# Patient Record
Sex: Male | Born: 1955 | Race: White | Hispanic: No | Marital: Single | Smoking: Former smoker
Health system: Southern US, Community
[De-identification: ages and names within clinical notes are randomized; demographics above are authoritative.]

## PROBLEM LIST (undated history)

## (undated) DIAGNOSIS — I10 Essential (primary) hypertension: Secondary | ICD-10-CM

## (undated) DIAGNOSIS — E785 Hyperlipidemia, unspecified: Secondary | ICD-10-CM

## (undated) DIAGNOSIS — Z86711 Personal history of pulmonary embolism: Secondary | ICD-10-CM

## (undated) DIAGNOSIS — C20 Malignant neoplasm of rectum: Secondary | ICD-10-CM

## (undated) DIAGNOSIS — E119 Type 2 diabetes mellitus without complications: Secondary | ICD-10-CM

## (undated) DIAGNOSIS — I8393 Asymptomatic varicose veins of bilateral lower extremities: Secondary | ICD-10-CM

## (undated) HISTORY — DX: Personal history of pulmonary embolism: Z86.711

## (undated) HISTORY — DX: Type 2 diabetes mellitus without complications: E11.9

## (undated) HISTORY — DX: Hyperlipidemia, unspecified: E78.5

## (undated) HISTORY — PX: LEG SURGERY: SHX1003

## (undated) HISTORY — PX: TONSILLECTOMY: SUR1361

## (undated) HISTORY — DX: Asymptomatic varicose veins of bilateral lower extremities: I83.93

## (undated) HISTORY — DX: Malignant neoplasm of rectum: C20

## (undated) HISTORY — PX: OTHER SURGICAL HISTORY: SHX169

---

## 2004-08-02 ENCOUNTER — Observation Stay (HOSPITAL_COMMUNITY): Admission: EM | Admit: 2004-08-02 | Discharge: 2004-08-04 | Payer: Self-pay | Admitting: *Deleted

## 2004-08-03 ENCOUNTER — Encounter: Payer: Self-pay | Admitting: *Deleted

## 2004-08-27 ENCOUNTER — Ambulatory Visit (HOSPITAL_COMMUNITY): Admission: RE | Admit: 2004-08-27 | Discharge: 2004-08-27 | Payer: Self-pay | Admitting: Internal Medicine

## 2006-03-09 ENCOUNTER — Encounter
Admission: RE | Admit: 2006-03-09 | Discharge: 2006-06-07 | Payer: Self-pay | Admitting: Physical Medicine & Rehabilitation

## 2006-12-02 IMAGING — CR DG CHEST 2V
2 series · 2 of 2 positions shown · non-contrast
Comparison: none

CLINICAL DATA: Chest pain

Chest 2 view:
Comparison 08/02/2004. Ill-defined opacity at the left lung base  on previous
study corresponds to the anterior aspect of the 6 left rib. Lungs are clear.
Heart size and pulmonary vascularity normal. No effusion.

[view not recorded (1 of 2)]
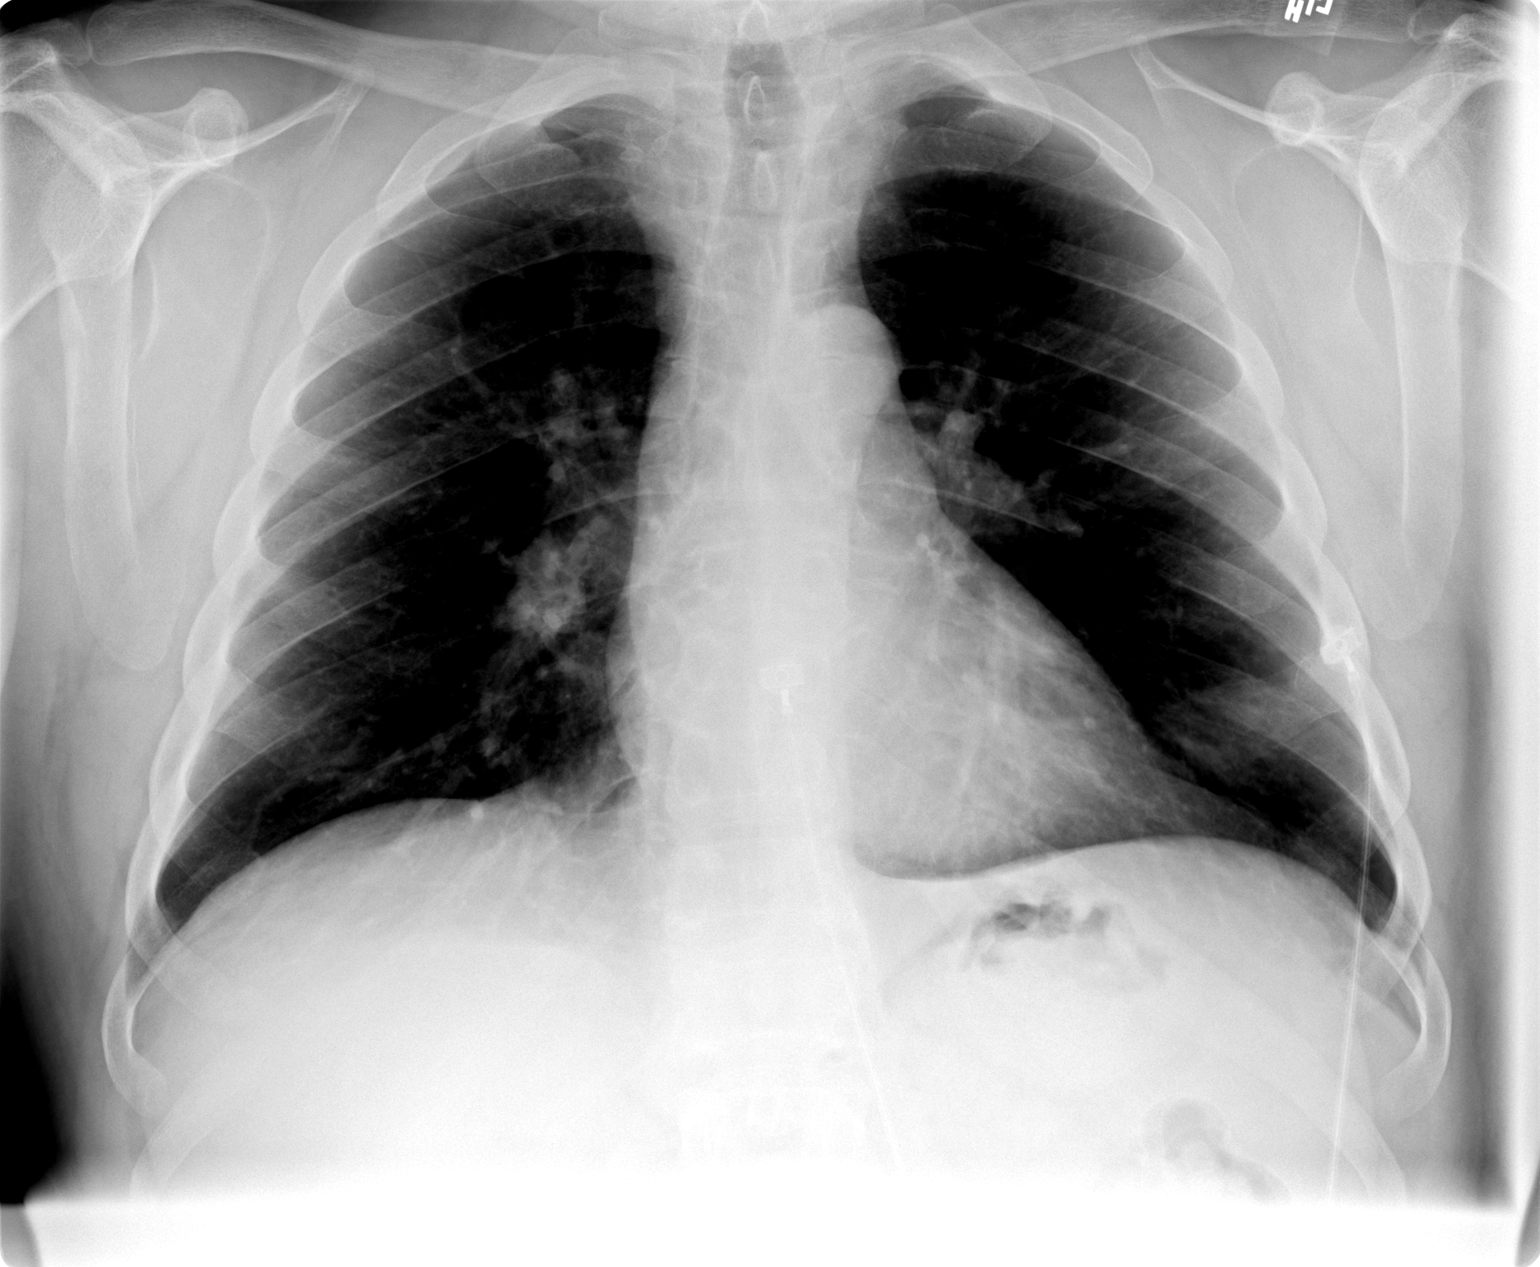

[view not recorded (2 of 2)]
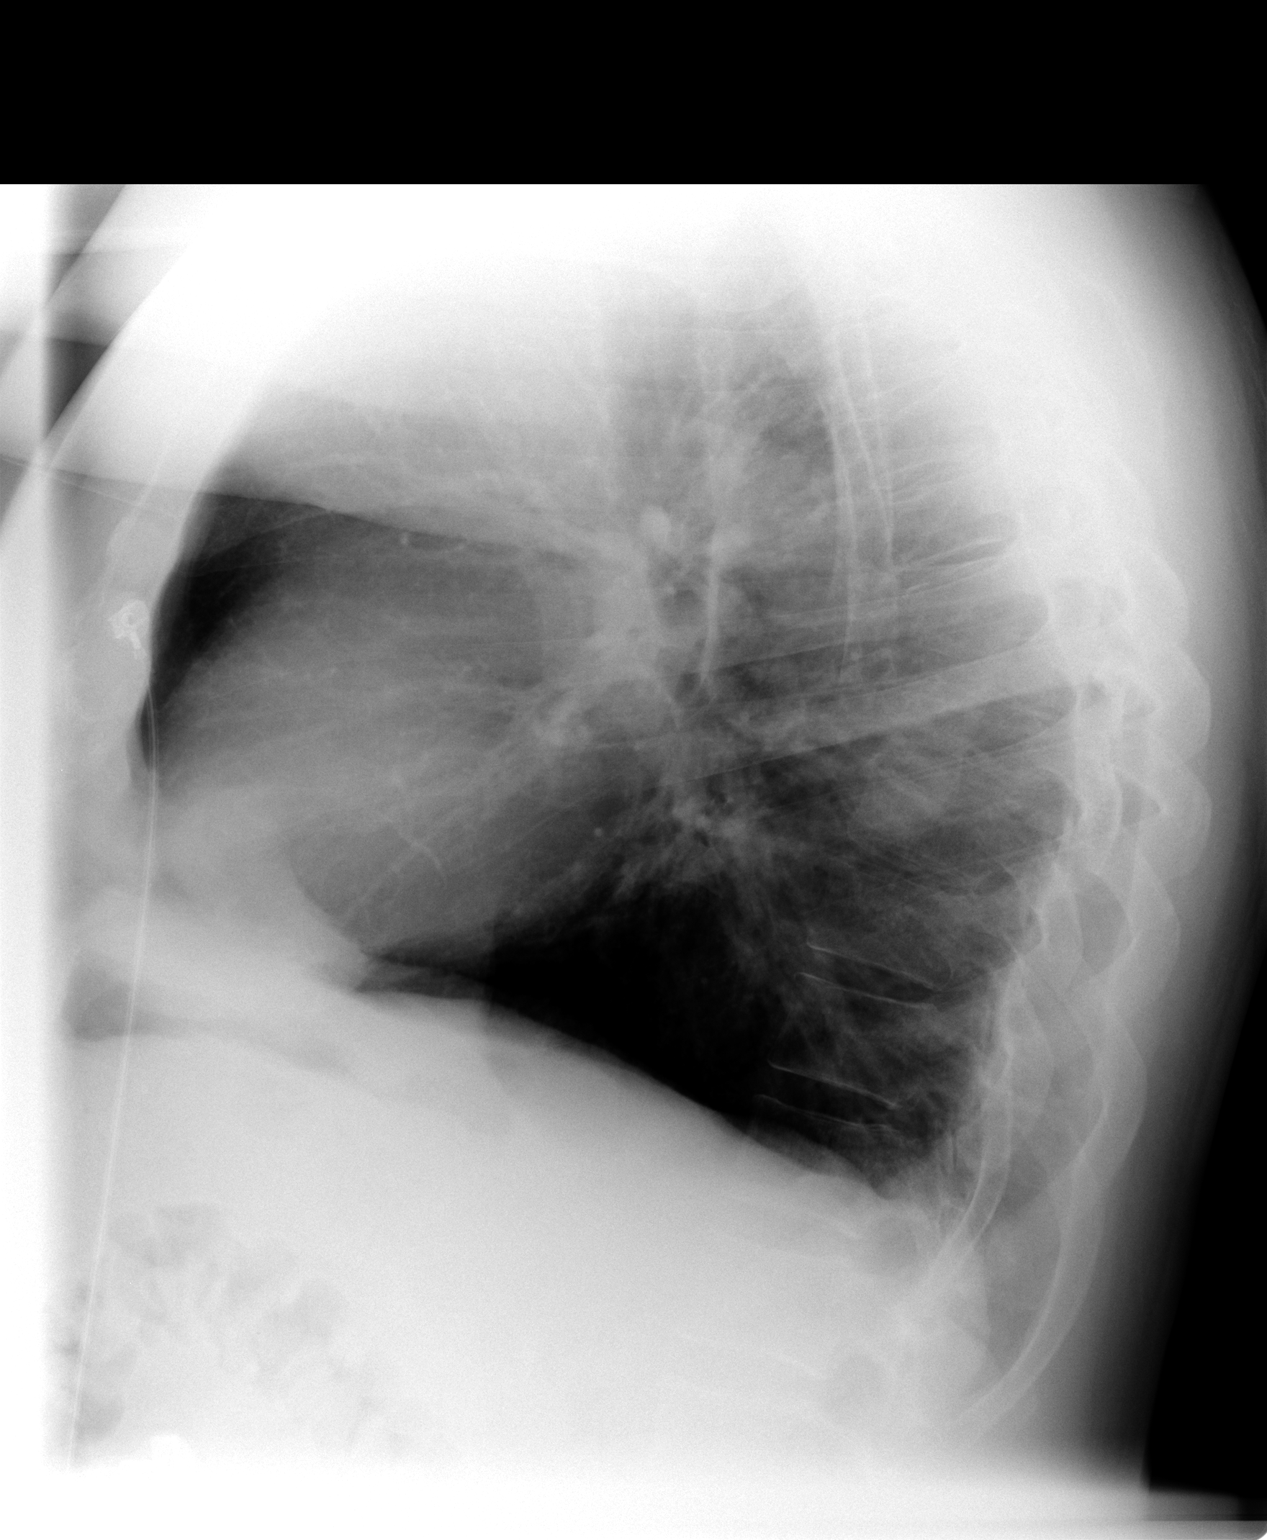

[2 of 2 positions shown; findings below may reference images not displayed]

IMPRESSION: 1. No acute disease

## 2006-12-03 IMAGING — CR DG CHEST 2V
2 series · 2 of 2 positions shown · non-contrast
Comparison: 08/02/2004.

CLINICAL DATA: Chest pain.  
CHEST - TWO VIEW:

[w chest pa]
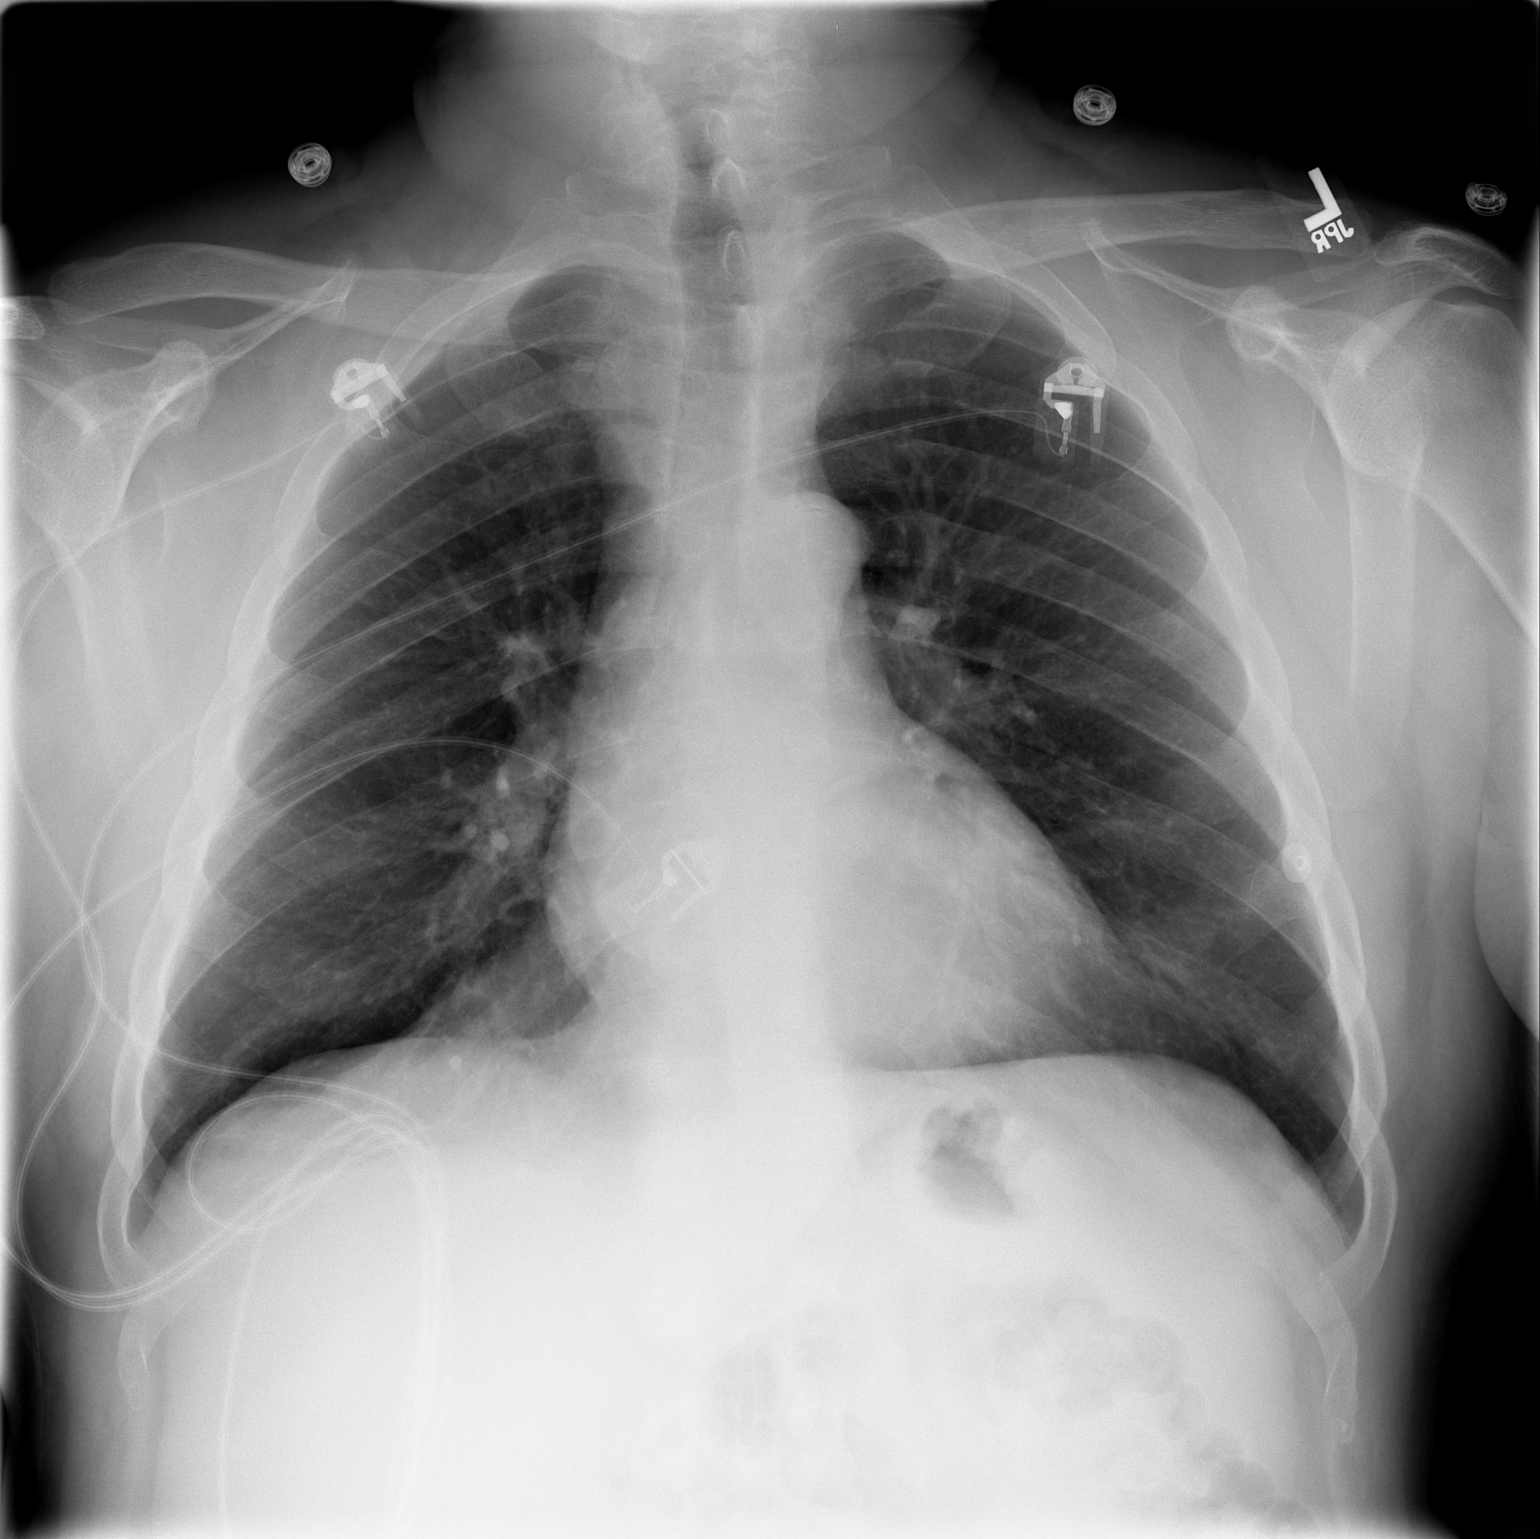

[w chest lat]
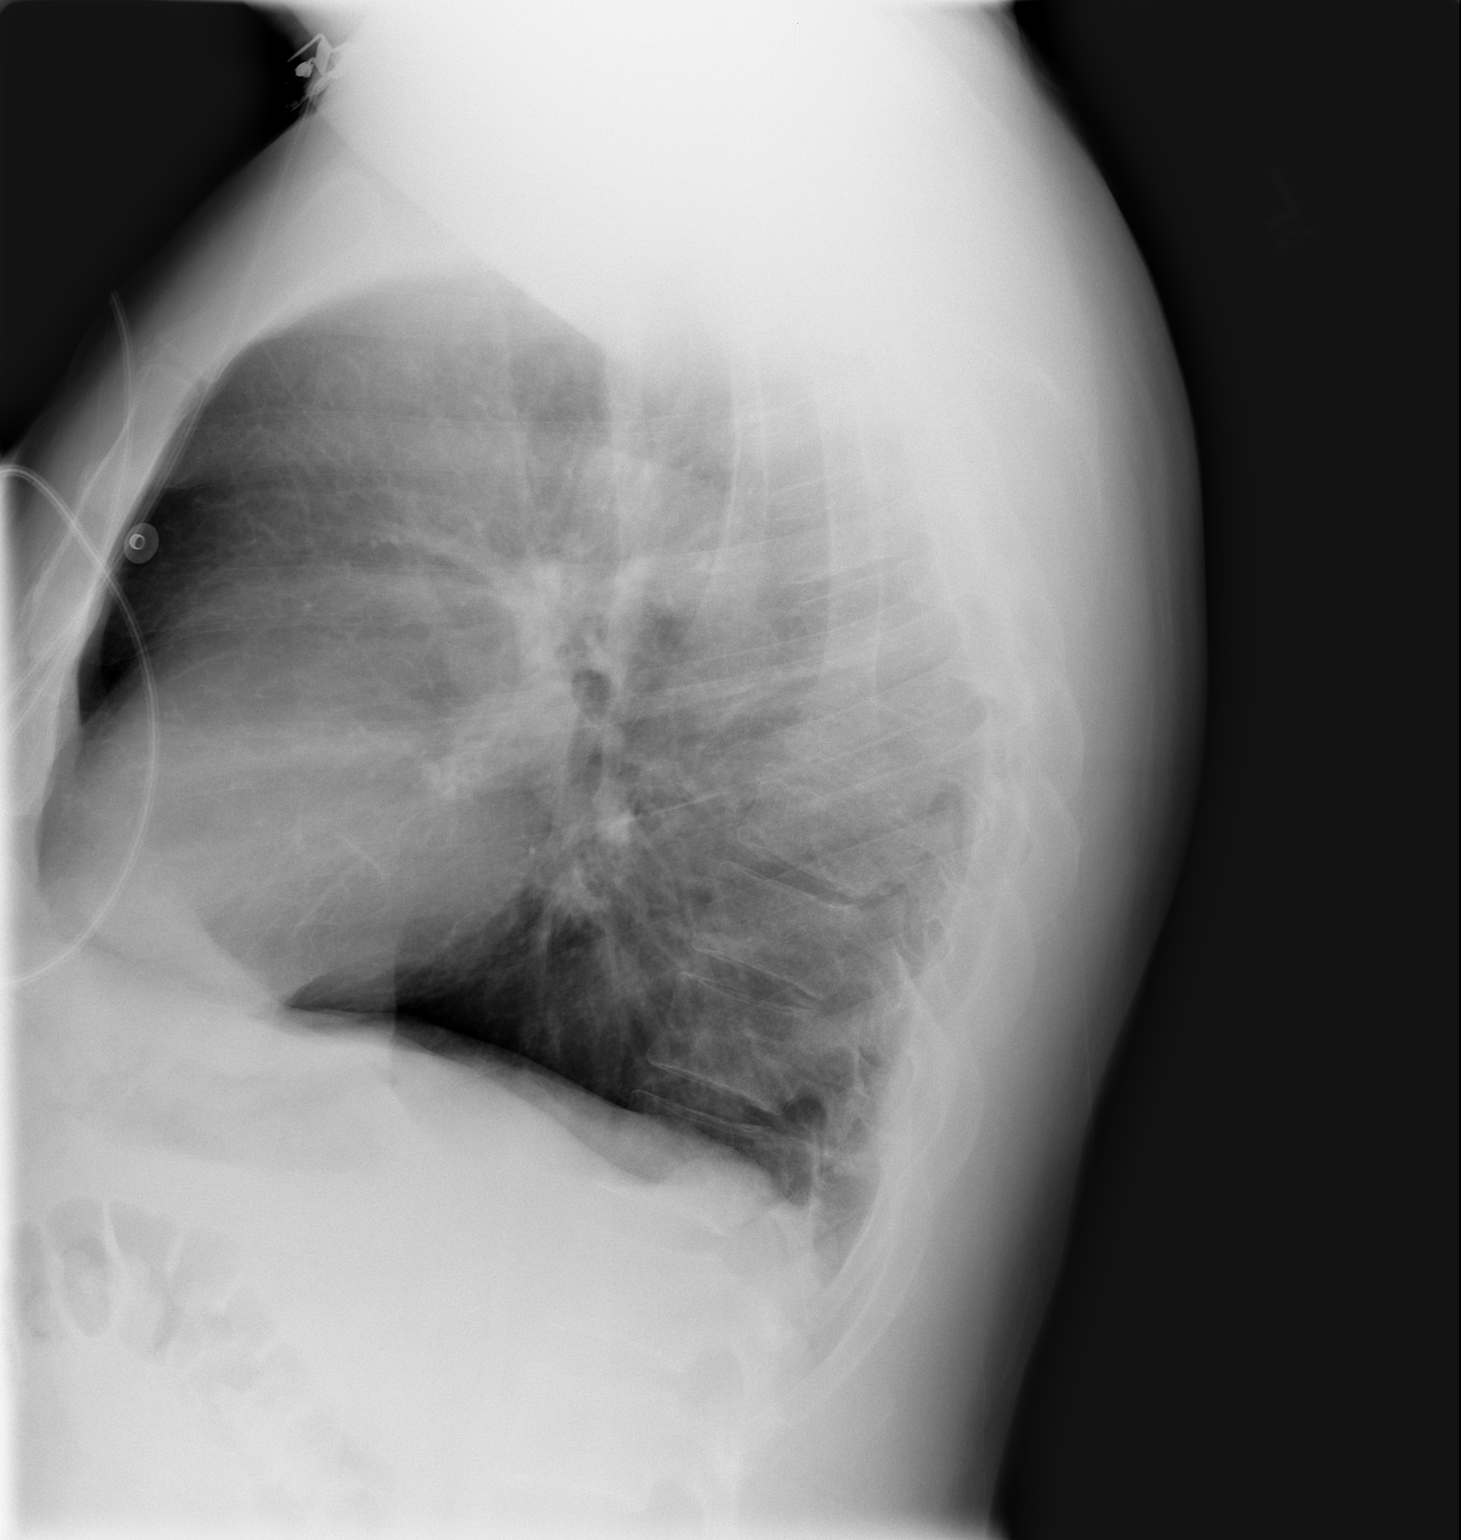

[2 of 2 positions shown; findings below may reference images not displayed]

FINDINGS: The cardiomediastinal silhouette is within normal limits.  The lungs are clear.  No pneumothoraces or effusions are seen.  A severe compression deformity of the mid-thoracic spine is noted.  On the lateral view, there is lobularity of the posterior hemidiaphragm.  This may reflect a diaphragmatic hernia or possibly a pleural abnormality.
IMPRESSION: 1.  Severe mid-thoracic compression fracture of indeterminate age. 
2.  A lobulated appearance of the posterior hemidiaphragm on the lateral view.  This may represent a diaphragmatic hernia or possibly a pleural abnormality such as loculated fluid or pleural soft tissue mass.  If no remote chest films are available for comparison, CT can be performed to further delineate pathology.

## 2010-05-14 ENCOUNTER — Emergency Department (HOSPITAL_COMMUNITY)
Admission: EM | Admit: 2010-05-14 | Discharge: 2010-05-14 | Disposition: A | Discharge status: Home / Self Care | Payer: Self-pay | Attending: Emergency Medicine | Admitting: Emergency Medicine

## 2010-05-14 DIAGNOSIS — E119 Type 2 diabetes mellitus without complications: Secondary | ICD-10-CM | POA: Insufficient documentation

## 2010-05-14 DIAGNOSIS — I10 Essential (primary) hypertension: Secondary | ICD-10-CM | POA: Insufficient documentation

## 2010-05-14 DIAGNOSIS — E7800 Pure hypercholesterolemia, unspecified: Secondary | ICD-10-CM | POA: Insufficient documentation

## 2010-05-14 DIAGNOSIS — R109 Unspecified abdominal pain: Secondary | ICD-10-CM | POA: Insufficient documentation

## 2010-05-14 DIAGNOSIS — Z79899 Other long term (current) drug therapy: Secondary | ICD-10-CM | POA: Insufficient documentation

## 2013-01-17 ENCOUNTER — Emergency Department (HOSPITAL_COMMUNITY): Payer: BC Managed Care – PPO

## 2013-01-17 ENCOUNTER — Encounter (HOSPITAL_COMMUNITY): Payer: Self-pay | Admitting: Emergency Medicine

## 2013-01-17 ENCOUNTER — Emergency Department (HOSPITAL_COMMUNITY)
Admission: EM | Admit: 2013-01-17 | Discharge: 2013-01-17 | Disposition: A | Discharge status: Home / Self Care | Payer: BC Managed Care – PPO | Attending: Emergency Medicine | Admitting: Emergency Medicine

## 2013-01-17 DIAGNOSIS — E119 Type 2 diabetes mellitus without complications: Secondary | ICD-10-CM | POA: Insufficient documentation

## 2013-01-17 DIAGNOSIS — G8929 Other chronic pain: Secondary | ICD-10-CM | POA: Insufficient documentation

## 2013-01-17 DIAGNOSIS — E7800 Pure hypercholesterolemia, unspecified: Secondary | ICD-10-CM | POA: Insufficient documentation

## 2013-01-17 DIAGNOSIS — H81399 Other peripheral vertigo, unspecified ear: Secondary | ICD-10-CM | POA: Insufficient documentation

## 2013-01-17 DIAGNOSIS — I10 Essential (primary) hypertension: Secondary | ICD-10-CM | POA: Insufficient documentation

## 2013-01-17 DIAGNOSIS — Z87891 Personal history of nicotine dependence: Secondary | ICD-10-CM | POA: Insufficient documentation

## 2013-01-17 DIAGNOSIS — I509 Heart failure, unspecified: Secondary | ICD-10-CM | POA: Insufficient documentation

## 2013-01-17 DIAGNOSIS — R109 Unspecified abdominal pain: Secondary | ICD-10-CM | POA: Insufficient documentation

## 2013-01-17 DIAGNOSIS — J3489 Other specified disorders of nose and nasal sinuses: Secondary | ICD-10-CM | POA: Insufficient documentation

## 2013-01-17 HISTORY — DX: Essential (primary) hypertension: I10

## 2013-01-17 LAB — CBC WITH DIFFERENTIAL/PLATELET
Basophils Absolute: 0.1 10*3/uL (ref 0.0–0.1)
Eosinophils Relative: 2 % (ref 0–5)
HCT: 45.5 % (ref 39.0–52.0)
Lymphs Abs: 3.9 10*3/uL (ref 0.7–4.0)
MCH: 32 pg (ref 26.0–34.0)
MCHC: 34.7 g/dL (ref 30.0–36.0)
Monocytes Absolute: 1.2 10*3/uL — ABNORMAL HIGH (ref 0.1–1.0)
Monocytes Relative: 8 % (ref 3–12)
RBC: 4.93 MIL/uL (ref 4.22–5.81)
RDW: 12.8 % (ref 11.5–15.5)

## 2013-01-17 LAB — URINALYSIS, ROUTINE W REFLEX MICROSCOPIC
Hgb urine dipstick: NEGATIVE
Leukocytes, UA: NEGATIVE
Nitrite: NEGATIVE
Protein, ur: NEGATIVE mg/dL
Specific Gravity, Urine: 1.025 (ref 1.005–1.030)

## 2013-01-17 LAB — COMPREHENSIVE METABOLIC PANEL
AST: 25 U/L (ref 0–37)
GFR calc Af Amer: >90 mL/min (ref >90)
Potassium: 4.3 mEq/L (ref 3.5–5.1)
Sodium: 135 mEq/L (ref 135–145)
Total Bilirubin: 0.3 mg/dL (ref 0.3–1.2)

## 2013-01-17 MED ORDER — ONDANSETRON HCL 4 MG PO TABS: 4.0000 mg | ORAL_TABLET | Freq: Three times a day (TID) | ORAL | Status: DC | PRN

## 2013-01-17 MED ORDER — MECLIZINE HCL 25 MG PO TABS: 25.0000 mg | ORAL_TABLET | Freq: Three times a day (TID) | ORAL | Status: DC | PRN

## 2013-01-17 MED ORDER — MECLIZINE HCL 12.5 MG PO TABS
25.0000 mg | ORAL_TABLET | Freq: Once | ORAL | Status: AC
  Administered 2013-01-17: 25 mg via ORAL
  Filled 2013-01-17 (×2): qty 2

## 2013-01-17 NOTE — ED Provider Notes (Signed)
CSN: [id number]     Arrival date & time 01/17/13  1335 History   First MD Initiated Contact with Patient 01/17/13 1340     Chief Complaint  Patient presents with  . Flank Pain  . Dizziness    HPI Pt was seen at 1340. Per pt and his spouse, c/o gradual onset and persistence of multiple intermittent episodes of "dizziness" for the past 2 days. Pt describes the dizziness as "everything is moving around." Pt states he has had runny/stuffy nose, sinus and ears congestion for the past 3 weeks. Pt also c/o gradual onset and persistence of constant left sided upper-lateral torso "pain" for the past several months to 1 year. Pt states he has an appointment with his PMD tomorrow for same. Denies any change in his symptoms since onset. Denies N/V/D, no dysuria/hematuria, no CP/palpitations, no SOB/cough, no injury, no rash, no visual changes, no focal motor weakness, no tingling/numbness in extremities, no ataxia, no slurred speech, no facial droop.    Past Medical History  Diagnosis Date  . Hypertension   . CHF (congestive heart failure)   . Chronic groin pain   . Diabetes mellitus without complication   . Hypercholesterolemia    Past Surgical History  Procedure Laterality Date  . Leg surgery    . Arm surgery      History  Substance Use Topics  . Smoking status: Former Games developer  . Smokeless tobacco: Current User  . Alcohol Use: Yes    Review of Systems ROS: Statement: All systems negative except as marked or noted in the HPI; Constitutional: Negative for fever and chills. ; ; Eyes: Negative for eye pain, redness and discharge. ; ; ENMT: Negative for ear pain, hoarseness, sore throat. +nasal congestion, rhinorrhea, sinus pressure. ; ; Cardiovascular: Negative for chest pain, palpitations, diaphoresis, dyspnea and peripheral edema. ; ; Respiratory: Negative for cough, wheezing and stridor. ; ; Gastrointestinal: Negative for nausea, vomiting, diarrhea, abdominal pain, blood in stool, hematemesis,  jaundice and rectal bleeding. . ; ; Genitourinary: +flank pain. Negative for dysuria, and hematuria. ; ; Musculoskeletal: Negative for back pain and neck pain. Negative for swelling and trauma.; ; Skin: Negative for pruritus, rash, abrasions, blisters, bruising and skin lesion.; ; Neuro: +"dizziness." Negative for headache, lightheadedness and neck stiffness. Negative for weakness, altered level of consciousness , altered mental status, extremity weakness, paresthesias, involuntary movement, seizure and syncope.      Allergies  Review of patient's allergies indicates no known allergies.  Home Medications  No current outpatient prescriptions on file. BP 157/91  Pulse 95  Temp(Src) 97.8 F (36.6 C) (Oral)  Resp 21  SpO2 95% Physical Exam 1345: Physical examination:  Nursing notes reviewed; Vital signs and O2 SAT reviewed;  Constitutional: Well developed, Well nourished, Well hydrated, In no acute distress; Head:  Normocephalic, atraumatic; Eyes: EOMI, PERRL, No scleral icterus; ENMT: TM's clear bilat. +edemetous nasal turbinates bilat with clear rhinorrhea. Mouth and pharynx normal, Mucous membranes moist; Neck: Supple, Full range of motion, No lymphadenopathy; Cardiovascular: Regular rate and rhythm, No gallop; Respiratory: Breath sounds clear & equal bilaterally, No wheezes.  Speaking full sentences with ease, Normal respiratory effort/excursion; Chest: Nontender, Movement normal; Abdomen: Protuberant. Soft, +mild left sided upper lateral torso tenderness to palp. No rash. Nondistended, Normal bowel sounds; Genitourinary: No CVA tenderness; Spine:  No midline CS, TS, LS tenderness.;; Extremities: Pulses normal, No tenderness, No edema, No calf edema or asymmetry.; Neuro: AA&Ox3, Major CN grossly intact. No facial droop. Speech clear. Climbs on  and off stretcher easily by himself. Gait steady. No gross focal motor or sensory deficits in extremities. +left horizontal end gaze fatigable nystagmus that  reproduces pt's "dizziness."; Skin: Color normal, Warm, Dry.   ED Course  Procedures  EKG Interpretation   None       MDM  MDM Reviewed: previous chart, nursing note and vitals Reviewed previous: labs Interpretation: labs and CT scan     Results for orders placed during the hospital encounter of 01/17/13  CBC WITH DIFFERENTIAL      Result Value Range   WBC 15.9 (*) 4.0 - 10.5 K/uL   RBC 4.93  4.22 - 5.81 MIL/uL   Hemoglobin 15.8  13.0 - 17.0 g/dL   HCT 19.1  47.8 - 29.5 %   MCV 92.3  78.0 - 100.0 fL   MCH 32.0  26.0 - 34.0 pg   MCHC 34.7  30.0 - 36.0 g/dL   RDW 62.1  30.8 - 65.7 %   Platelets 249  150 - 400 K/uL   Neutrophils Relative % 66  43 - 77 %   Neutro Abs 10.5 (*) 1.7 - 7.7 K/uL   Lymphocytes Relative 24  12 - 46 %   Lymphs Abs 3.9  0.7 - 4.0 K/uL   Monocytes Relative 8  3 - 12 %   Monocytes Absolute 1.2 (*) 0.1 - 1.0 K/uL   Eosinophils Relative 2  0 - 5 %   Eosinophils Absolute 0.3  0.0 - 0.7 K/uL   Basophils Relative 1  0 - 1 %   Basophils Absolute 0.1  0.0 - 0.1 K/uL  COMPREHENSIVE METABOLIC PANEL      Result Value Range   Sodium 135  135 - 145 mEq/L   Potassium 4.3  3.5 - 5.1 mEq/L   Chloride 97  96 - 112 mEq/L   CO2 28  19 - 32 mEq/L   Glucose, Bld 118 (*) 70 - 99 mg/dL   BUN 17  6 - 23 mg/dL   Creatinine, Ser 8.46  0.50 - 1.35 mg/dL   Calcium 96.2  8.4 - 95.2 mg/dL   Total Protein 8.0  6.0 - 8.3 g/dL   Albumin 4.1  3.5 - 5.2 g/dL   AST 25  0 - 37 U/L   ALT 33  0 - 53 U/L   Alkaline Phosphatase 82  39 - 117 U/L   Total Bilirubin 0.3  0.3 - 1.2 mg/dL   GFR calc non Af Amer >90  >90 mL/min   GFR calc Af Amer >90  >90 mL/min  LIPASE, BLOOD      Result Value Range   Lipase 40  11 - 59 U/L  URINALYSIS, ROUTINE W REFLEX MICROSCOPIC      Result Value Range   Color, Urine YELLOW  YELLOW   APPearance CLEAR  CLEAR   Specific Gravity, Urine 1.025  1.005 - 1.030   pH 6.0  5.0 - 8.0   Glucose, UA NEGATIVE  NEGATIVE mg/dL   Hgb urine dipstick  NEGATIVE  NEGATIVE   Bilirubin Urine NEGATIVE  NEGATIVE   Ketones, ur NEGATIVE  NEGATIVE mg/dL   Protein, ur NEGATIVE  NEGATIVE mg/dL   Urobilinogen, UA 0.2  0.0 - 1.0 mg/dL   Nitrite NEGATIVE  NEGATIVE   Leukocytes, UA NEGATIVE  NEGATIVE   Ct Abdomen Pelvis Wo Contrast 01/17/2013   CLINICAL DATA:  Left flank pain, dizziness  EXAM: CT ABDOMEN AND PELVIS WITHOUT CONTRAST  TECHNIQUE: Multidetector CT imaging of  the abdomen and pelvis was performed following the standard protocol without intravenous contrast.  COMPARISON:  None.  FINDINGS: Sagittal images of the spine are unremarkable. Lung bases are unremarkable. Unenhanced liver shows no biliary ductal dilatation. Noncalcified gallstone within gallbladder measures arteries 2.5 cm. No pericholecystic fluid. The pancreas, spleen and adrenals are unremarkable. Unenhanced kidneys are symmetrical in size. No nephrolithiasis. No hydronephrosis or hydroureter. Small accessory splenule. No calcified ureteral calculi. Normal appendix. No pericecal inflammation. No aortic aneurysm.  Moderate stool noted in left colon and transverse colon. Some stool noted in sigmoid colon. No small bowel or colonic obstruction. No ascites or free air. No adenopathy.  Prostate gland and seminal vesicles are unremarkable. Small left inguinal carinal hernia containing fat without evidence of acute complication. No destructive bony lesions are noted within pelvis.  IMPRESSION: 1. There is non calcified gallstone within gallbladder measures 2.5 cm. 2. No nephrolithiasis.  No hydronephrosis or hydroureter. 3. No calcified ureteral calculi. 4. Normal appendix.  No pericecal inflammation. 5. Moderate colonic stool.   Electronically Signed   By: Natasha Mead M.D.   On: 01/17/2013 14:29   Ct Head Wo Contrast 01/17/2013   CLINICAL DATA:  Dizziness.  Abdominal pain  EXAM: CT HEAD WITHOUT CONTRAST  TECHNIQUE: Contiguous axial images were obtained from the base of the skull through the vertex  without intravenous contrast.  COMPARISON:  None.  FINDINGS: Ventricle size is normal. Negative for acute or chronic infarct. Negative for hemorrhage or mass. No skull lesion identified.  IMPRESSION: Normal   Electronically Signed   By: Marlan Palau M.D.   On: 01/17/2013 14:24    1500:  Pt states he feels better now after antivert and wants to go home. Not orthostatic. Has tol PO well without N/V. Neuro exam remains intact and unchanged. Appears to be vertigo at this time; will tx symptomatically. No acute findings on CT scan to account for left sided torso "pain;" pt already has PMD f/u tomorrow. Dx and testing d/w pt and family.  Questions answered.  Verb understanding, agreeable to d/c home with outpt f/u.     Laray Anger, DO 01/20/13 1422

## 2013-01-17 NOTE — ED Notes (Signed)
Pt c/o left flank pain as well as intermittent dizziness. Pt states symptoms have been present "for awhile". Pt denies NVD.

## 2013-01-17 NOTE — ED Notes (Signed)
C/o llq pain intermittent x 2 days along with dizziness.

## 2014-05-26 ENCOUNTER — Encounter (HOSPITAL_COMMUNITY): Payer: Self-pay | Admitting: *Deleted

## 2014-05-26 ENCOUNTER — Emergency Department (HOSPITAL_COMMUNITY)
Admission: EM | Admit: 2014-05-26 | Discharge: 2014-05-26 | Disposition: A | Discharge status: Home / Self Care | Payer: 59 | Attending: Emergency Medicine | Admitting: Emergency Medicine

## 2014-05-26 DIAGNOSIS — I509 Heart failure, unspecified: Secondary | ICD-10-CM | POA: Diagnosis not present

## 2014-05-26 DIAGNOSIS — Z79899 Other long term (current) drug therapy: Secondary | ICD-10-CM | POA: Insufficient documentation

## 2014-05-26 DIAGNOSIS — Z7982 Long term (current) use of aspirin: Secondary | ICD-10-CM | POA: Insufficient documentation

## 2014-05-26 DIAGNOSIS — E119 Type 2 diabetes mellitus without complications: Secondary | ICD-10-CM | POA: Diagnosis not present

## 2014-05-26 DIAGNOSIS — L600 Ingrowing nail: Secondary | ICD-10-CM | POA: Diagnosis present

## 2014-05-26 DIAGNOSIS — I10 Essential (primary) hypertension: Secondary | ICD-10-CM | POA: Diagnosis not present

## 2014-05-26 DIAGNOSIS — G8929 Other chronic pain: Secondary | ICD-10-CM | POA: Insufficient documentation

## 2014-05-26 MED ORDER — SULFAMETHOXAZOLE-TRIMETHOPRIM 800-160 MG PO TABS
1.0000 | ORAL_TABLET | Freq: Once | ORAL | Status: AC
  Administered 2014-05-26: 1 via ORAL
  Filled 2014-05-26: qty 1

## 2014-05-26 MED ORDER — BUPIVACAINE HCL (PF) 0.25 % IJ SOLN
10.0000 mL | Freq: Once | INTRAMUSCULAR | Status: AC
  Administered 2014-05-26: 10 mL
  Filled 2014-05-26: qty 30

## 2014-05-26 MED ORDER — OXYCODONE-ACETAMINOPHEN 5-325 MG PO TABS: 1.0000 | ORAL_TABLET | Freq: Once | ORAL | Status: DC

## 2014-05-26 MED ORDER — SULFAMETHOXAZOLE-TRIMETHOPRIM 800-160 MG PO TABS: 1.0000 | ORAL_TABLET | Freq: Two times a day (BID) | ORAL | Status: AC

## 2014-05-26 MED ORDER — LIDOCAINE HCL (PF) 1 % IJ SOLN
5.0000 mL | Freq: Once | INTRAMUSCULAR | Status: AC
  Administered 2014-05-26: 5 mL
  Filled 2014-05-26: qty 5

## 2014-05-26 MED ORDER — IBUPROFEN 600 MG PO TABS: 600.0000 mg | ORAL_TABLET | Freq: Four times a day (QID) | ORAL | Status: DC | PRN

## 2014-05-26 MED ORDER — OXYCODONE-ACETAMINOPHEN 5-325 MG PO TABS: 2.0000 | ORAL_TABLET | ORAL | Status: DC | PRN

## 2014-05-26 NOTE — ED Provider Notes (Signed)
CSN: [id number]     Arrival date & time 05/26/14  1853 History  This chart was scribed for non-physician practitioner Debroah Baller, NP, working with Orpah Greek, MD by Zola Button, ED Scribe. This patient was seen in room APFT24/APFT24 and the patient's care was started at 7:48 PM.      Chief Complaint  Patient presents with  . Ingrown Toenail   The history is provided by the patient. No language interpreter was used.   HPI Comments: Donald Mason is a 59 y.o. male who presents to the Emergency Department complaining of gradual onset, worsening ingrown toenail to left great toe that started 2 weeks ago but worsened this past week. Patient reports pain, redness and swelling to the area. He has tried peroxide. He has called his PCP, but is unable to see him for 10 weeks.   Past Medical History  Diagnosis Date  . Hypertension   . CHF (congestive heart failure)   . Chronic groin pain   . Diabetes mellitus without complication   . Hypercholesterolemia    Past Surgical History  Procedure Laterality Date  . Leg surgery    . Arm surgery     History reviewed. No pertinent family history. History  Substance Use Topics  . Smoking status: Former Research scientist (life sciences)  . Smokeless tobacco: Current User  . Alcohol Use: Yes    Review of Systems  Musculoskeletal:       Left great toe pain and redness  Skin: Positive for wound.      Allergies  Review of patient's allergies indicates no known allergies.  Home Medications   Prior to Admission medications   Medication Sig Start Date End Date Taking? Authorizing Provider  acetaminophen (TYLENOL) 500 MG tablet Take 1,000 mg by mouth every 6 (six) hours as needed for mild pain.   Yes Historical Provider, MD  Aspirin-Salicylamide-Caffeine (BC HEADACHE POWDER PO) Take 1 packet by mouth every 4 (four) hours as needed (Pain).   Yes Historical Provider, MD  Cyanocobalamin (VITAMIN B-12 PO) Take 2 tablets by mouth daily.   Yes Historical  Provider, MD  furosemide (LASIX) 40 MG tablet Take 40 mg by mouth daily.    Yes Historical Provider, MD  hydroxypropyl methylcellulose / hypromellose (ISOPTO TEARS / GONIOVISC) 2.5 % ophthalmic solution Place 1 drop into both eyes as needed for dry eyes.   Yes Historical Provider, MD  lisinopril (PRINIVIL,ZESTRIL) 20 MG tablet Take 20 mg by mouth daily.   Yes Historical Provider, MD  metFORMIN (GLUCOPHAGE) 500 MG tablet Take 500 mg by mouth daily.    Yes Historical Provider, MD  Omega-3 Fatty Acids (FISH OIL PO) Take 2 capsules by mouth daily.   Yes Historical Provider, MD  potassium chloride (K-DUR) 10 MEQ tablet Take 10 mEq by mouth daily.   Yes Historical Provider, MD  ibuprofen (ADVIL,MOTRIN) 600 MG tablet Take 1 tablet (600 mg total) by mouth every 6 (six) hours as needed. 05/26/14   Novice Vrba Bunnie Pion, NP  meclizine (ANTIVERT) 25 MG tablet Take 1 tablet (25 mg total) by mouth 3 (three) times daily as needed for dizziness. Patient not taking: Reported on 05/26/2014 01/17/13   Francine Graven, DO  ondansetron (ZOFRAN) 4 MG tablet Take 1 tablet (4 mg total) by mouth every 8 (eight) hours as needed for nausea or vomiting. Patient not taking: Reported on 05/26/2014 01/17/13   Francine Graven, DO  oxyCODONE-acetaminophen (PERCOCET/ROXICET) 5-325 MG per tablet Take 2 tablets by mouth every 4 (four) hours as  needed for severe pain. 05/26/14   Jakeia Carreras Bunnie Pion, NP  pregabalin (LYRICA) 100 MG capsule Take 100 mg by mouth 2 (two) times daily.    Historical Provider, MD  sulfamethoxazole-trimethoprim (BACTRIM DS,SEPTRA DS) 800-160 MG per tablet Take 1 tablet by mouth 2 (two) times daily. 05/26/14 06/02/14  [place], NP   BP 115/68 mmHg  Pulse 74  Temp(Src) 98.7 F (37.1 C) (Oral)  Resp 18  Ht 5\' 9"  (1.753 m)  Wt 229 lb (103.874 kg)  BMI 33.80 kg/m2  SpO2 100% Physical Exam  Constitutional: He is oriented to person, place, and time. He appears well-developed and well-nourished.  Eyes: EOM are normal.  Neck:  Neck supple.  Pulmonary/Chest: Effort normal.  Abdominal: Soft. There is no tenderness.  Musculoskeletal: Normal range of motion.  Neurological: He is alert and oriented to person, place, and time. No cranial nerve deficit.  Skin: Skin is warm and dry.  Left great toe: Swollen and tender at the edge of the nail. Infected ingrown toenail.  Nursing note and vitals reviewed.   ED Course  NAIL REMOVAL Date/Time: 05/26/2014 9:10 PM Performed by: Ashley Murrain Authorized by: Ashley Murrain Consent: Verbal consent obtained. Risks and benefits: risks, benefits and alternatives were discussed Consent given by: patient Patient understanding: patient states understanding of the procedure being performed Required items: required blood products, implants, devices, and special equipment available Patient identity confirmed: verbally with patient Location: left foot Anesthesia: digital block Local anesthetic: bupivacaine 0.25% without epinephrine and lidocaine 1% without epinephrine Anesthetic total: 5 ml Patient sedated: no Preparation: skin prepped with Betadine and sterile field established Amount removed: 1/5 Wedge excision of skin of nail fold: yes Nail bed sutured: no Nail matrix removed: partial Removed nail replaced and anchored: no Dressing: dressing applied Patient tolerance: Patient tolerated the procedure well with no immediate complications     DIAGNOSTIC STUDIES: Oxygen Saturation is 100% on room air, normal by my interpretation.    COORDINATION OF CARE: 7:50 PM-Discussed treatment plan which includes digital block and partial nail removal with pt at bedside and pt agreed to plan.     MDM  59 y.o. male with pain, redness and swelling to the left great toe due to infected ingrown toenail. Will treat with antibiotics and pain medication. He will continue to soak the toe in warm water and return for worsening symptoms. Stable for discharge without red streaking.  Final  diagnoses:  Ingrown left big toenail   I personally performed the services described in this documentation, which was scribed in my presence. The recorded information has been reviewed and is accurate.    [address] [place], NP 05/27/14 [place], MD 05/28/14 1515

## 2014-05-26 NOTE — ED Notes (Signed)
Pt c/o ingrown toenail to left toe x 1 week; toe is red and swollen with some brownish-green drainage; pt states it is painful when it rubs against an object

## 2014-05-26 NOTE — ED Notes (Signed)
Pt was given a pre-package of Oxycodone-Acetaminophen quantity 6, given instructions on use, pt verbally understands.

## 2014-05-26 NOTE — Discharge Instructions (Signed)
Ingrown Toenail An ingrown toenail occurs when the sharp edge of your toenail grows into the skin. Causes of ingrown toenails include toenails clipped too far back or poorly fitting shoes. Activities involving sudden stops (basketball, tennis) causing "toe jamming" may lead to an ingrown nail. HOME CARE INSTRUCTIONS   Soak the whole foot in warm soapy water for 20 minutes, 3 times per day.  You may lift the edge of the nail away from the sore skin by wedging a small piece of cotton under the corner of the nail. Be careful not to dig (traumatize) and cause more injury to the area.  Wear shoes that fit well. While the ingrown nail is causing problems, sandals may be beneficial.  Trim your toenails regularly and carefully. Cut your toenails straight across, not in a curve. This will prevent injury to the skin at the corners of the toenail.  Keep your feet clean and dry.  Crutches may be helpful early in treatment if walking is painful.  Antibiotics, if prescribed, should be taken as directed.  Return for a wound check in 2 days or as directed.  Only take over-the-counter or prescription medicines for pain, discomfort, or fever as directed by your caregiver. SEEK IMMEDIATE MEDICAL CARE IF:   You have a fever.  You have increasing pain, redness, swelling, or heat at the wound site.  Your toe is not better in 7 days. If conservative treatment is not successful, surgical removal of a portion or all of the nail may be necessary. MAKE SURE YOU:   Understand these instructions.  Will watch your condition.  Will get help right away if you are not doing well or get worse. Document Released: 01/30/2000 Document Revised: 04/26/2011 Document Reviewed: 01/24/2008 Exit[employer] Patient Information 2015 [employer]. This information is not intended to replace advice given to you by your health care provider. Make sure you discuss any questions you have with your health care provider.  

## 2014-05-28 MED FILL — Oxycodone w/ Acetaminophen Tab 5-325 MG: ORAL | Qty: 6 | Status: AC

## 2015-01-13 ENCOUNTER — Emergency Department (HOSPITAL_COMMUNITY): Payer: Medicaid Other

## 2015-01-13 ENCOUNTER — Emergency Department (HOSPITAL_COMMUNITY)
Admission: EM | Admit: 2015-01-13 | Discharge: 2015-01-13 | Disposition: A | Discharge status: Home / Self Care | Payer: Medicaid Other | Attending: Emergency Medicine | Admitting: Emergency Medicine

## 2015-01-13 ENCOUNTER — Encounter (HOSPITAL_COMMUNITY): Payer: Self-pay | Admitting: Emergency Medicine

## 2015-01-13 DIAGNOSIS — G8929 Other chronic pain: Secondary | ICD-10-CM | POA: Insufficient documentation

## 2015-01-13 DIAGNOSIS — R5383 Other fatigue: Secondary | ICD-10-CM | POA: Insufficient documentation

## 2015-01-13 DIAGNOSIS — Z79899 Other long term (current) drug therapy: Secondary | ICD-10-CM | POA: Diagnosis not present

## 2015-01-13 DIAGNOSIS — I10 Essential (primary) hypertension: Secondary | ICD-10-CM | POA: Insufficient documentation

## 2015-01-13 DIAGNOSIS — Z87891 Personal history of nicotine dependence: Secondary | ICD-10-CM | POA: Insufficient documentation

## 2015-01-13 DIAGNOSIS — I509 Heart failure, unspecified: Secondary | ICD-10-CM | POA: Insufficient documentation

## 2015-01-13 DIAGNOSIS — R140 Abdominal distension (gaseous): Secondary | ICD-10-CM | POA: Insufficient documentation

## 2015-01-13 DIAGNOSIS — E119 Type 2 diabetes mellitus without complications: Secondary | ICD-10-CM | POA: Diagnosis not present

## 2015-01-13 DIAGNOSIS — R1032 Left lower quadrant pain: Secondary | ICD-10-CM | POA: Insufficient documentation

## 2015-01-13 DIAGNOSIS — R232 Flushing: Secondary | ICD-10-CM | POA: Insufficient documentation

## 2015-01-13 DIAGNOSIS — K625 Hemorrhage of anus and rectum: Secondary | ICD-10-CM

## 2015-01-13 DIAGNOSIS — M545 Low back pain: Secondary | ICD-10-CM | POA: Insufficient documentation

## 2015-01-13 DIAGNOSIS — R1031 Right lower quadrant pain: Secondary | ICD-10-CM | POA: Insufficient documentation

## 2015-01-13 LAB — CBC
HEMATOCRIT: 47.3 % (ref 39.0–52.0)
HEMOGLOBIN: 16.6 g/dL (ref 13.0–17.0)
MCH: 31.4 pg (ref 26.0–34.0)
MCHC: 35.1 g/dL (ref 30.0–36.0)
MCV: 89.6 fL (ref 78.0–100.0)
Platelets: 294 10*3/uL (ref 150–400)
RBC: 5.28 MIL/uL (ref 4.22–5.81)
RDW: 13 % (ref 11.5–15.5)
WBC: 11.5 10*3/uL — ABNORMAL HIGH (ref 4.0–10.5)

## 2015-01-13 LAB — COMPREHENSIVE METABOLIC PANEL
ALBUMIN: 4.3 g/dL (ref 3.5–5.0)
ALK PHOS: 74 U/L (ref 38–126)
ALT: 22 U/L (ref 17–63)
ANION GAP: 8 (ref 5–15)
AST: 21 U/L (ref 15–41)
BUN: 13 mg/dL (ref 6–20)
CHLORIDE: 101 mmol/L (ref 101–111)
CO2: 26 mmol/L (ref 22–32)
Calcium: 9.2 mg/dL (ref 8.9–10.3)
Creatinine, Ser: 0.73 mg/dL (ref 0.61–1.24)
GFR calc non Af Amer: >60 mL/min (ref >60)
GLUCOSE: 92 mg/dL (ref 65–99)
POTASSIUM: 4.5 mmol/L (ref 3.5–5.1)
SODIUM: 135 mmol/L (ref 135–145)
Total Bilirubin: 0.6 mg/dL (ref 0.3–1.2)
Total Protein: 8 g/dL (ref 6.5–8.1)

## 2015-01-13 LAB — TYPE AND SCREEN
ABO/RH(D): A POS
Antibody Screen: NEGATIVE

## 2015-01-13 LAB — PROTIME-INR
INR: 1.07 (ref 0.00–1.49)
PROTHROMBIN TIME: 14.1 s (ref 11.6–15.2)

## 2015-01-13 MED ORDER — IOHEXOL 300 MG/ML  SOLN
150.0000 mL | Freq: Once | INTRAMUSCULAR | Status: AC | PRN
  Administered 2015-01-13: 100 mL via INTRAVENOUS

## 2015-01-13 NOTE — ED Notes (Signed)
STOOL HEMOCULT POSITIVE

## 2015-01-13 NOTE — ED Notes (Signed)
Thomas called from [place]. Pt scheduled for heart cath at 330pm. [facility] will be here approx 230pm to transport. Pt notified

## 2015-01-13 NOTE — ED Provider Notes (Signed)
CSN: [id number]     Arrival date & time 01/13/15  1014 History  By signing my name below, I, Donald Mason, attest that this documentation has been prepared under the direction and in the presence of Davonna Belling, MD. Electronically Signed: Eustaquio Mason, ED Scribe. 01/13/2015. 11:35 AM.  Chief Complaint  Patient presents with  . Rectal Bleeding   The history is provided by the patient and the spouse. No language interpreter was used.     HPI Comments: Donald Mason is a 59 y.o. male who presents to the Emergency Department complaining of constant rectal bleeding x 2 months. Wife reports that pt usually has 1-2 episodes of bowel movements with blood per day and has straight bright red blood clots the rest of the time. Wife finally made pt come to the ED today to be evaluated. Pt mentions that he has been feeling sluggish for the past couple of months since onset and has been having intermittent lower back pain and LLQ/RLQ pain. Pt has also had abdominal bloating for the past 2 months. He states that his abdomen continues to feel bloated despite having bowel movement. Pt reports that a few months ago he dropped from 265 pounds to 213 without trying. He mentions that he gained some of that weight back by eating a lot of food. Pt's current weight is 230 pounds. He denies any other associated symptoms.   Past Medical History  Diagnosis Date  . Hypertension   . CHF (congestive heart failure) ([place])   . Chronic groin pain   . Diabetes mellitus without complication ([place])   . Hypercholesterolemia    Past Surgical History  Procedure Laterality Date  . Leg surgery    . Arm surgery     History reviewed. No pertinent family history. Social History  Substance Use Topics  . Smoking status: Former Research scientist (life sciences)  . Smokeless tobacco: Current User    Types: Snuff  . Alcohol Use: Yes     Comment: quit 3-4 years ago.  Recovering alcoholic    Review of Systems  Constitutional: Positive for  fatigue and unexpected weight change. Negative for fever and chills.  Gastrointestinal: Positive for abdominal pain, blood in stool, abdominal distention and anal bleeding. Negative for nausea, vomiting, diarrhea and constipation.  Genitourinary: Negative for hematuria.  Musculoskeletal: Positive for back pain.      Allergies  Review of patient's allergies indicates no known allergies.  Home Medications   Prior to Admission medications   Medication Sig Start Date End Date Taking? Authorizing Provider  acetaminophen (TYLENOL) 500 MG tablet Take 1,000 mg by mouth every 6 (six) hours as needed for mild pain.   Yes Historical Provider, MD  Aspirin-Salicylamide-Caffeine (BC HEADACHE POWDER PO) Take 1 packet by mouth every 4 (four) hours as needed (Pain).   Yes Historical Provider, MD  furosemide (LASIX) 40 MG tablet Take 40 mg by mouth daily.    Yes Historical Provider, MD  ibuprofen (ADVIL,MOTRIN) 200 MG tablet Take 600-1,000 mg by mouth every 6 (six) hours as needed for moderate pain.   Yes Historical Provider, MD  lisinopril (PRINIVIL,ZESTRIL) 20 MG tablet Take 20 mg by mouth daily.   Yes Historical Provider, MD  metFORMIN (GLUCOPHAGE) 500 MG tablet Take 500 mg by mouth daily.    Yes Historical Provider, MD  Omega-3 Fatty Acids (FISH OIL PO) Take 2 capsules by mouth daily.   Yes Historical Provider, MD  potassium chloride (K-DUR) 10 MEQ tablet Take 10 mEq by mouth daily.  Yes Historical Provider, MD   Triage Vitals: BP 163/133 mmHg  Pulse 82  Temp(Src) 98 F (36.7 C) (Oral)  Resp 18  Ht 5\' 9"  (1.753 m)  Wt 230 lb (104.327 kg)  BMI 33.95 kg/m2  SpO2 100%   Physical Exam  Constitutional: He is oriented to person, place, and time. He appears well-developed and well-nourished. No distress.  Pt appears flushed  HENT:  Head: Normocephalic and atraumatic.  Eyes: Conjunctivae and EOM are normal.  Neck: Neck supple. No tracheal deviation present.  Cardiovascular: Normal rate.    Pulmonary/Chest: Effort normal. No respiratory distress.  Abdominal: He exhibits distension.  Mildly distended abdomen No hepatomegaly  Musculoskeletal: Normal range of motion. He exhibits no edema.  No peripheral edema  Neurological: He is alert and oriented to person, place, and time.  Skin: Skin is warm and dry.  Psychiatric: He has a normal mood and affect. His behavior is normal.  Nursing note and vitals reviewed.   ED Course  Procedures (including critical care time)  DIAGNOSTIC STUDIES: Oxygen Saturation is 100% on RA, normal by my interpretation.    COORDINATION OF CARE: 11:35 AM-Discussed treatment plan which includes Protime INR, CMP, CBC, and occult blood with pt at bedside and pt agreed to plan.   Labs Review Labs Reviewed  CBC - Abnormal; Notable for the following:    WBC 11.5 (*)    All other components within normal limits  COMPREHENSIVE METABOLIC PANEL  PROTIME-INR  POC OCCULT BLOOD, ED  TYPE AND SCREEN    Imaging Review Ct Abdomen Pelvis W Contrast  01/13/2015  CLINICAL DATA:  Generalized abdominal pain with rectal bleeding EXAM: CT ABDOMEN AND PELVIS WITH CONTRAST TECHNIQUE: Multidetector CT imaging of the abdomen and pelvis was performed using the standard protocol following bolus administration of intravenous contrast. Oral contrast was also administered. CONTRAST:  143mL OMNIPAQUE IOHEXOL 300 MG/ML  SOLN COMPARISON:  January 17, 2013 FINDINGS: Lower chest: There is mild atelectatic change in the right base. The lung bases are otherwise clear. Hepatobiliary: No focal liver lesions are identified. There is cholelithiasis. There is a noncalcified gallstone measuring 2.4 x 2.0 cm. Gallbladder wall does not appear appreciably thickened. There is no appreciable biliary duct dilatation. Pancreas: There is no demonstrable pancreatic mass or pancreatic fluid collection. There is mild fatty replacement within the pancreas. Spleen: No splenic lesions are identified. A  small splenule is noted medial to the inferior spleen. Adrenals/Urinary Tract: Adrenals appear normal bilaterally. Kidneys bilaterally show no mass or hydronephrosis on either side. There is no renal or ureteral calculus on either side. The urinary bladder is midline with wall thickness within normal limits. Stomach/Bowel: There are multiple sigmoid diverticula without diverticulitis. Scattered colonic diverticula are noted elsewhere in the colon without inflammation. There is no bowel wall or mesenteric thickening. No bowel obstruction. No free air or portal venous air. Vascular/Lymphatic: There is no abdominal aortic aneurysm. Major mesenteric vessels appear widely patent. There is no demonstrable adenopathy in the abdomen or pelvis. Reproductive: Prostate and seminal vesicles appear normal in size and contour. There is no pelvic mass or pelvic fluid collection. Other: The appendix appears normal. No abscess or ascites is appreciable in the abdomen or pelvis. There is fat in the left inguinal ring. There is a minimal ventral hernia containing only fat. Musculoskeletal: There are no blastic or lytic bone lesions. There is no intramuscular or abdominal wall lesion. IMPRESSION: Cholelithiasis. Gallbladder wall does not appear appreciably thickened. Colonic diverticulosis, most notably in the  sigmoid colon. No demonstrable diverticulitis. No bowel wall thickening or bowel obstruction. Appendix appears normal.  No abscess. No renal or ureteral calculus.  No hydronephrosis. Electronically Signed   By: Lowella Grip III M.D.   On: 01/13/2015 13:36   I have personally reviewed and evaluated these lab results as part of my medical decision-making.   EKG Interpretation None      MDM   Final diagnoses:  Rectal bleeding   Patient has had blood in the stool for the last couple months. Guaiac positive here. Lab work overall reassuring. CT scan done and showed diverticulitis. Vitals reassuring. Will discharge  home to follow-up with gastroenterology. Patient's wife is concerned that he will not do this but I do not find a reason to admit to hospital at this time. I personally performed the services described in this documentation, which was scribed in my presence. The recorded information has been reviewed and is accurate.        Davonna Belling, MD 01/13/15 1434

## 2015-01-13 NOTE — ED Notes (Signed)
ERROR  ED noted filed at 1301 on wrong patient

## 2015-01-13 NOTE — Discharge Instructions (Signed)

## 2015-01-13 NOTE — ED Notes (Signed)
Pt states that he has been having heavy rectal bleeding for several months now.  States that he has bloody stools about 10 times per day.

## 2015-02-06 ENCOUNTER — Encounter: Payer: Self-pay | Admitting: Gastroenterology

## 2015-02-06 ENCOUNTER — Ambulatory Visit (INDEPENDENT_AMBULATORY_CARE_PROVIDER_SITE_OTHER): Payer: Medicaid Other | Admitting: Gastroenterology

## 2015-02-06 ENCOUNTER — Other Ambulatory Visit: Payer: Self-pay

## 2015-02-06 VITALS — BP 142/84 | HR 73 | Temp 97.3°F | Ht 69.0 in | Wt 244.4 lb

## 2015-02-06 DIAGNOSIS — K625 Hemorrhage of anus and rectum: Secondary | ICD-10-CM | POA: Diagnosis not present

## 2015-02-06 NOTE — Patient Instructions (Addendum)
DRINK WATER TO KEEP YOUR URINE LIGHT YELLOW.  FOLLOW A HIGH FIBER DIET. AVOID ITEMS THAT CAUSE BLOATING & GAS. SEE INFO BELOW.  USE PREPARATION H 4 TIMES A DAY FOR 10 DAYS TO RELIEVE RECTAL PRESSURE/ITCHING/BLEEDING.  COLonoscopy and possible upper endoscopy in 2-3 weeks.  FOLLOW UP IN 4 MOS.   High-Fiber Diet A high-fiber diet changes your normal diet to include more whole grains, legumes, fruits, and vegetables. Changes in the diet involve replacing refined carbohydrates with unrefined foods. The calorie level of the diet is essentially unchanged. The Dietary Reference Intake (recommended amount) for adult males is 38 grams per day. For adult females, it is 25 grams per day. Pregnant and lactating women should consume 28 grams of fiber per day. Fiber is the intact part of a plant that is not broken down during digestion. Functional fiber is fiber that has been isolated from the plant to provide a beneficial effect in the body. PURPOSE  Increase stool bulk.   Ease and regulate bowel movements.   Lower cholesterol.  REDUCE RISK OF COLON CANCER  INDICATIONS THAT YOU NEED MORE FIBER  Constipation and hemorrhoids.   Uncomplicated diverticulosis (intestine condition) and irritable bowel syndrome.   Weight management.   As a protective measure against hardening of the arteries (atherosclerosis), diabetes, and cancer.   GUIDELINES FOR INCREASING FIBER IN THE DIET  Start adding fiber to the diet slowly. A gradual increase of about 5 more grams (2 slices of whole-wheat bread, 2 servings of most fruits or vegetables, or 1 bowl of high-fiber cereal) per day is best. Too rapid an increase in fiber may result in constipation, flatulence, and bloating.   Drink enough water and fluids to keep your urine clear or pale yellow. Water, juice, or caffeine-free drinks are recommended. Not drinking enough fluid may cause constipation.   Eat a variety of high-fiber foods rather than one type of  fiber.   Try to increase your intake of fiber through using high-fiber foods rather than fiber pills or supplements that contain small amounts of fiber.   The goal is to change the types of food eaten. Do not supplement your present diet with high-fiber foods, but replace foods in your present diet.  INCLUDE A VARIETY OF FIBER SOURCES  Replace refined and processed grains with whole grains, canned fruits with fresh fruits, and incorporate other fiber sources. White rice, white breads, and most bakery goods contain little or no fiber.   Brown whole-grain rice, buckwheat oats, and many fruits and vegetables are all good sources of fiber. These include: broccoli, Brussels sprouts, cabbage, cauliflower, beets, sweet potatoes, white potatoes (skin on), carrots, tomatoes, eggplant, squash, berries, fresh fruits, and dried fruits.   Cereals appear to be the richest source of fiber. Cereal fiber is found in whole grains and bran. Bran is the fiber-rich outer coat of cereal grain, which is largely removed in refining. In whole-grain cereals, the bran remains. In breakfast cereals, the largest amount of fiber is found in those with "bran" in their names. The fiber content is sometimes indicated on the label.   You may need to include additional fruits and vegetables each day.   In baking, for 1 cup white flour, you may use the following substitutions:   1 cup whole-wheat flour minus 2 tablespoons.   1/2 cup white flour plus 1/2 cup whole-wheat flour.

## 2015-02-06 NOTE — Progress Notes (Signed)
ON RECALL  °

## 2015-02-06 NOTE — Assessment & Plan Note (Signed)
Associated with weight loss and LUQ abdominal pain. DIFFERENTIAL DIAGNOSIS INCLUDES HEMORRHOIDS, COLON POLYPS, AVMs, & LESS LIKELY GASTRIC/COLON CA.  REVIEWED LABS AND CT FROM NOV 2016. DRINK WATER EAT FIBER ENDOSCOPY IN 2-3 WEEKS. DISCUSSED PROCEDURE, BENEFITS, & RISKS: < 1% chance of medication reaction, bleeding, perforation, or rupture of spleen/liver. PREPARATION H QID FOR 10 DAYS FOLLOW UP IN 4 MOS.

## 2015-02-06 NOTE — Progress Notes (Addendum)
Subjective:    Patient ID: Donald Mason, male    DOB: 03/17/1955, 59 y.o.   MRN: [id number]  Donald Savage, MD  HPI No headache powder use. USES TYLENOL OR IBUPROFEN PRN FOR PAIN. RECTAL BLEEDING FOR PAST 2-3 MOS. HAS BLOOD CLOTS 10-15 TIMES A DAY. NO STRAINING BUT WHEN HAS BM DOESN'T REALLY GET BETTER. HARD TO SIT AND DRIVE. UNEMPLOYED RIGHT NOW. NO HEAVY LIFTING. 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 HAS HAD SOME BLACK STOOLS. HAS HAD BLOOD LEAK OUT. BEFORE ALL THIS STARTED AND NL FORMED STOOL 3-4 TIMES A DAY.  LOWER ABDOMINAL CRAMPING/LUQ BEFORE SOME BLOODY STOOLS.  WEIGHT LOSS: 272 TO 214 AND NOW BACK UP 244 LBS TODAY. APPETITE: WAS DOWN BUT NOW BACK TO NORMAL.  PT DENIES FEVER, CHILLS, nausea, vomiting, diarrhea, CHEST PAIN, SHORTNESS OF BREATH, CHANGE IN BOWEL IN HABITS, constipation, problems swallowing, OR heartburn or indigestion.  Past Medical History  Diagnosis Date  . Hypertension   . CHF (congestive heart failure) ([place])   . Chronic groin pain   . Diabetes mellitus without complication ([place])   . Hypercholesterolemia    Past Surgical History  Procedure Laterality Date  . Leg surgery    . Arm surgery      No Known Allergies  Current Outpatient Prescriptions  Medication Sig Dispense Refill  . acetaminophen (TYLENOL) 500 MG tablet Take 1,000 mg by mouth every 6 (six) hours as needed for mild pain.    . furosemide (LASIX) 40 MG tablet Take 40 mg by mouth daily.     Marland Kitchen ibuprofen (ADVIL,MOTRIN) 200 MG tablet Take 600-1,000 mg by mouth every 6 (six) hours as needed for moderate pain.    Marland Kitchen lisinopril (PRINIVIL,ZESTRIL) 20 MG tablet Take 20 mg by mouth daily.    . metFORMIN (GLUCOPHAGE) 500 MG tablet Take 500 mg by mouth daily.     . Omega-3 Fatty Acids (FISH OIL PO) Take 2 capsules by mouth daily.    . potassium chloride (K-DUR) 10 MEQ tablet Take 10 mEq by mouth daily.    .       Family History  Problem Relation Age of Onset  . Pancreatic cancer Neg Hx   . Stomach cancer  Neg Hx   . Ulcerative colitis Neg Hx   . Crohn's disease Neg Hx   . Colon polyps Brother   . Colon cancer Paternal Grandmother     OVER AGE 59   Social History   Social History  . Marital Status: Single    Spouse Name: N/A  . Number of Children: N/A  . Years of Education: N/A   Social History Main Topics  . Smoking status: Former Research scientist (life sciences)  . Smokeless tobacco: Current User    Types: Snuff  . Alcohol Use: Yes     Comment: quit 3-4 years ago.  Recovering alcoholic  . Drug Use: No  . Sexual Activity: Not Asked   Other Topics Concern  . 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 2 KIDS: Donald Mason(32), Donald Mason(29).    Review of Systems PER HPI OTHERWISE ALL SYSTEMS ARE NEGATIVE.     Objective:   Physical Exam  Constitutional: He is oriented to person, place, and time. He appears well-developed and well-nourished. No distress.  HENT:  Head: Normocephalic and atraumatic.  Mouth/Throat: Oropharynx is clear and moist. No oropharyngeal exudate.  Eyes: Pupils are equal, round, and reactive to light. No scleral icterus.  Neck: Normal range of motion. Neck supple.  Cardiovascular: Normal rate, regular rhythm and normal heart sounds.   Pulmonary/Chest: Effort normal and breath sounds normal. No respiratory distress.  Abdominal: Soft. Bowel sounds are normal. He exhibits no distension. There is no tenderness.  Genitourinary: Rectal exam shows no internal hemorrhoid, no mass, no tenderness and anal tone normal. Guaiac positive stool.  Musculoskeletal: He exhibits no edema.  Lymphadenopathy:    He has no cervical adenopathy.  Neurological: He is alert and oriented to person, place, and time.  NO FOCAL DEFICITS  Psychiatric: He has a normal mood and affect.  Vitals reviewed.         Assessment & Plan:

## 2015-02-06 NOTE — Progress Notes (Signed)
CC'ED TO PCP 

## 2015-02-16 HISTORY — PX: COLON RESECTION: SHX5231

## 2015-02-26 ENCOUNTER — Encounter (HOSPITAL_COMMUNITY)
Admission: RE | Disposition: A | Discharge status: Still In Hospital | Payer: Self-pay | Source: Ambulatory Visit | Attending: Gastroenterology

## 2015-02-26 ENCOUNTER — Encounter (HOSPITAL_COMMUNITY): Payer: Self-pay | Admitting: *Deleted

## 2015-02-26 ENCOUNTER — Ambulatory Visit (HOSPITAL_COMMUNITY)
Admission: RE | Admit: 2015-02-26 | Discharge: 2015-02-26 | Disposition: A | Discharge status: Home / Self Care | Payer: Medicaid Other | Source: Ambulatory Visit | Attending: Gastroenterology | Admitting: Gastroenterology

## 2015-02-26 ENCOUNTER — Telehealth: Payer: Self-pay | Admitting: Gastroenterology

## 2015-02-26 ENCOUNTER — Ambulatory Visit (HOSPITAL_COMMUNITY)
Admission: RE | Admit: 2015-02-26 | Discharge: 2015-02-26 | Disposition: A | Discharge status: Still In Hospital | Payer: Medicaid Other | Source: Ambulatory Visit | Attending: Gastroenterology | Admitting: Gastroenterology

## 2015-02-26 DIAGNOSIS — C20 Malignant neoplasm of rectum: Secondary | ICD-10-CM | POA: Diagnosis not present

## 2015-02-26 DIAGNOSIS — E119 Type 2 diabetes mellitus without complications: Secondary | ICD-10-CM | POA: Insufficient documentation

## 2015-02-26 DIAGNOSIS — K2970 Gastritis, unspecified, without bleeding: Secondary | ICD-10-CM | POA: Diagnosis not present

## 2015-02-26 DIAGNOSIS — Z87891 Personal history of nicotine dependence: Secondary | ICD-10-CM | POA: Insufficient documentation

## 2015-02-26 DIAGNOSIS — I509 Heart failure, unspecified: Secondary | ICD-10-CM | POA: Insufficient documentation

## 2015-02-26 DIAGNOSIS — R918 Other nonspecific abnormal finding of lung field: Secondary | ICD-10-CM | POA: Diagnosis not present

## 2015-02-26 DIAGNOSIS — K929 Disease of digestive system, unspecified: Secondary | ICD-10-CM

## 2015-02-26 DIAGNOSIS — E7800 Pure hypercholesterolemia, unspecified: Secondary | ICD-10-CM | POA: Diagnosis not present

## 2015-02-26 DIAGNOSIS — Z7984 Long term (current) use of oral hypoglycemic drugs: Secondary | ICD-10-CM | POA: Insufficient documentation

## 2015-02-26 DIAGNOSIS — K6289 Other specified diseases of anus and rectum: Secondary | ICD-10-CM | POA: Diagnosis not present

## 2015-02-26 DIAGNOSIS — R1012 Left upper quadrant pain: Secondary | ICD-10-CM | POA: Diagnosis not present

## 2015-02-26 DIAGNOSIS — R1032 Left lower quadrant pain: Secondary | ICD-10-CM | POA: Insufficient documentation

## 2015-02-26 DIAGNOSIS — I110 Hypertensive heart disease with heart failure: Secondary | ICD-10-CM | POA: Insufficient documentation

## 2015-02-26 DIAGNOSIS — B9681 Helicobacter pylori [H. pylori] as the cause of diseases classified elsewhere: Secondary | ICD-10-CM | POA: Insufficient documentation

## 2015-02-26 DIAGNOSIS — K625 Hemorrhage of anus and rectum: Secondary | ICD-10-CM

## 2015-02-26 DIAGNOSIS — K2950 Unspecified chronic gastritis without bleeding: Secondary | ICD-10-CM | POA: Diagnosis not present

## 2015-02-26 DIAGNOSIS — K921 Melena: Secondary | ICD-10-CM | POA: Diagnosis present

## 2015-02-26 DIAGNOSIS — D120 Benign neoplasm of cecum: Secondary | ICD-10-CM | POA: Insufficient documentation

## 2015-02-26 HISTORY — PX: COLONOSCOPY: SHX5424

## 2015-02-26 HISTORY — PX: ESOPHAGOGASTRODUODENOSCOPY: SHX5428

## 2015-02-26 LAB — HEPATIC FUNCTION PANEL
ALBUMIN: 3.5 g/dL (ref 3.5–5.0)
ALT: 18 U/L (ref 17–63)
AST: 20 U/L (ref 15–41)
Alkaline Phosphatase: 69 U/L (ref 38–126)
BILIRUBIN INDIRECT: 0.2 mg/dL — AB (ref 0.3–0.9)
Bilirubin, Direct: 0.2 mg/dL (ref 0.1–0.5)
TOTAL PROTEIN: 6 g/dL — AB (ref 6.5–8.1)
Total Bilirubin: 0.4 mg/dL (ref 0.3–1.2)

## 2015-02-26 SURGERY — COLONOSCOPY
Anesthesia: Moderate Sedation

## 2015-02-26 MED ORDER — MIDAZOLAM HCL 5 MG/5ML IJ SOLN
INTRAMUSCULAR | Status: AC
  Filled 2015-02-26: qty 10

## 2015-02-26 MED ORDER — MEPERIDINE HCL 100 MG/ML IJ SOLN
INTRAMUSCULAR | Status: DC | PRN
  Administered 2015-02-26: 25 mg via INTRAVENOUS
  Administered 2015-02-26: 50 mg via INTRAVENOUS
  Administered 2015-02-26: 25 mg via INTRAVENOUS

## 2015-02-26 MED ORDER — SODIUM CHLORIDE 0.9 % IJ SOLN
INTRAMUSCULAR | Status: AC
  Filled 2015-02-26: qty 3

## 2015-02-26 MED ORDER — SODIUM CHLORIDE 0.9 % IV SOLN
INTRAVENOUS | Status: DC
  Administered 2015-02-26: 1000 mL via INTRAVENOUS

## 2015-02-26 MED ORDER — PROMETHAZINE HCL 25 MG/ML IJ SOLN
INTRAMUSCULAR | Status: AC
  Filled 2015-02-26: qty 1

## 2015-02-26 MED ORDER — STERILE WATER FOR IRRIGATION IR SOLN
Status: DC | PRN
  Administered 2015-02-26: 13:00:00

## 2015-02-26 MED ORDER — MEPERIDINE HCL 100 MG/ML IJ SOLN
INTRAMUSCULAR | Status: AC
  Filled 2015-02-26: qty 2

## 2015-02-26 MED ORDER — PROMETHAZINE HCL 25 MG/ML IJ SOLN
12.5000 mg | Freq: Once | INTRAMUSCULAR | Status: AC
  Administered 2015-02-26: 12.5 mg via INTRAVENOUS

## 2015-02-26 MED ORDER — LIDOCAINE VISCOUS 2 % MT SOLN
OROMUCOSAL | Status: AC
  Filled 2015-02-26: qty 15

## 2015-02-26 MED ORDER — LIDOCAINE VISCOUS 2 % MT SOLN
OROMUCOSAL | Status: DC | PRN
  Administered 2015-02-26: 4 mL via OROMUCOSAL

## 2015-02-26 MED ORDER — MIDAZOLAM HCL 5 MG/5ML IJ SOLN
INTRAMUSCULAR | Status: DC | PRN
  Administered 2015-02-26 (×2): 1 mg via INTRAVENOUS
  Administered 2015-02-26 (×3): 2 mg via INTRAVENOUS

## 2015-02-26 NOTE — Interval H&P Note (Signed)
History and Physical Interval Note:  02/26/2015 12:47 PM  Donald Mason  has presented today for surgery, with the diagnosis of RECTAL BLEEDING/BARRETT'S SCREENING  The various methods of treatment have been discussed with the patient and family. After consideration of risks, benefits and other options for treatment, the patient has consented to  Procedure(s) with comments: COLONOSCOPY (N/A) - 1245  ESOPHAGOGASTRODUODENOSCOPY (EGD) (N/A) as a surgical intervention .  The patient's history has been reviewed, patient examined, no change in status, stable for surgery.  I have reviewed the patient's chart and labs.  Questions were answered to the patient's satisfaction.     [employer]

## 2015-02-26 NOTE — Op Note (Signed)
[facility] [address] [place], [address]   ENDOSCOPY PROCEDURE REPORT  PATIENT: Donald Mason, Donald Mason  MR#: [id number] BIRTHDATE: 26-Nov-1955 , 14  yrs. old GENDER: male  ENDOSCOPIST: Danie Binder, MD REFERRED [id number] Wenda Overland, M.D.  PROCEDURE DATE: Mar 20, 2015 PROCEDURE:   EGD w/ biopsy INDICATIONS:screening for Barrett's.  MEDICATIONS: TCS+ Versed 2 mg IV  MD INITIATED/ORDERED SEDATION: 1306, LAST DOSE: 1358. EGD/TCS COMPLETE: 1404  TOPICAL ANESTHETIC: Viscous Xylocaine    ASA CLASS:  DESCRIPTION OF PROCEDURE:     Physical exam was performed.  Informed consent was obtained from the patient after explaining the benefits, risks, and alternatives to the procedure.  The patient was connected to the monitor and placed in the left lateral position.  Continuous oxygen was provided by nasal cannula and IV medicine administered through an indwelling cannula.  After administration of sedation, the patients esophagus was intubated and the [id number]  endoscope was advanced under direct visualization to the second portion of the duodenum.  The scope was removed slowly by carefully examining the color, texture, anatomy, and integrity of the mucosa on the way out.  The patient was recovered in endoscopy and discharged home in satisfactory condition.  Estimated blood loss is zero unless otherwise noted in this procedure report.    ESOPHAGUS: The mucosa of the esophagus appeared normal.   STOMACH: Mild non-erosive gastritis (inflammation) was found in the gastric antrum.  Multiple biopsies were performed using cold forceps. DUODENUM: The duodenal mucosa showed no abnormalities in the bulb and 2nd part of the duodenum. COMPLICATIONS: There were no immediate complications.  ENDOSCOPIC IMPRESSION: 1.   NORMAL ESOPHAGUS 2.   MILD Non-erosive gastritis  RECOMMENDATIONS: BIOPSY WILL BE BACK on FRI OR MON. HFP/CEA & CHEST X-RAY TODAY. REPEAT FULL COLONOSCOPY IN  6 mos to 1 YEAR. ALL SISTERS, BROTHERS, CHILDREN, AND PARENTS NEED TO HAVE A COLONOSCOPY STARTING AT THE AGE OF 40 and then every 5 years. REFER TO [facility] COLORECTAL SURGERY FOR RESECTION.  REPEAT EXAM:  eSigned:  Danie Binder, MD 03/20/2015 7:15 PM  CPT CODES: ICD CODES:  The ICD and CPT codes recommended by this software are interpretations from the data that the clinical staff has captured with the software.  The verification of the translation of this report to the ICD and CPT codes and modifiers is the sole responsibility of the health care institution and practicing physician where this report was generated.  [employer]. will not be held responsible for the validity of the ICD and CPT codes included on this report.  AMA assumes no liability for data contained or not contained herein. CPT is a Designer, television/film set of the [employer].

## 2015-02-26 NOTE — Op Note (Addendum)
[facility] [address] [place], [address]   COLONOSCOPY PROCEDURE REPORT  PATIENT: Donald Mason, Donald Mason  MR#: [id number] BIRTHDATE: 11/10/1955 , 63  yrs. old GENDER: male ENDOSCOPIST: Danie Binder, MD REFERRED [id number] Wenda Overland, M.D. PROCEDURE DATE:  03/06/2015 PROCEDURE:   Colonoscopy with cold biopsy polypectomy and Colonoscopy with biopsy INDICATIONS:hematochezia. MEDICATIONS: Demerol 100 mg IV and Versed 5 mg IV  MD INITIATED/ORDERED SEDATION: 1306, LAST DOSE: 1358. EGD/TCS COMPLETE: 1404  DESCRIPTION OF PROCEDURE:    Physical exam was performed.  Informed consent was obtained from the patient after explaining the benefits, risks, and alternatives to procedure.  The patient was connected to monitor and placed in left lateral position. Continuous oxygen was provided by nasal cannula and IV medicine administered through an indwelling cannula.  After administration of sedation and rectal exam, the patients rectum was intubated and the [id number]  colonoscope was advanced under direct visualization to the cecum.  The scope was removed slowly by carefully examining the color, texture, anatomy, and integrity mucosa on the way out.  The patient was recovered in endoscopy and discharged home in satisfactory condition. Estimated blood loss is zero unless otherwise noted in this procedure report.    COLON FINDINGS: A near circumferential ulcerated mass with friable surfaces was found located 10 cm from the point of entry & EXTENDS TO 20 CM FROMTHE ANAL VERGE. MASS BEGINS JUST PROXIMAL TO THE FIRST VALVE OF [place]. Multiple biopsies were performed using cold forceps. ONE 3 MM CECAL AND TWO 4 MM RECTAL sessile polypS REMOVED VIA COLD FORCEPS.  PREP QUALITY: excellent.  CECAL W/D TIME: 16       minutes COMPLICATIONS: None  ENDOSCOPIC IMPRESSION: 1.   10 CM Near circumferential RECTAL mass 2.   THREE COLORECTAL polyps REMOVED  RECOMMENDATIONS: BIOPSY  WILL BE BACK on FRI OR MON. HFP/CEA & A CHEST X-RAY TODAY. FULL COLONOSCOPY IN 6 mos to 1 YEAR. ALL SISTERS, BROTHERS, CHILDREN, AND PARENTS NEED TO HAVE A COLONOSCOPY STARTING AT THE AGE OF 40 and then every 5 years     _______________________________ Lorrin MaisDanie Binder, MD 03-06-2015 7:08 PM Revised: 2015/03/06 7:08 PM   CPT CODES: ICD CODES:  The ICD and CPT codes recommended by this software are interpretations from the data that the clinical staff has captured with the software.  The verification of the translation of this report to the ICD and CPT codes and modifiers is the sole responsibility of the health care institution and practicing physician where this report was generated.  [employer] will not be held responsible for the validity of the ICD and CPT codes included on this report.  AMA assumes no liability for data contained or not contained herein. CPT is a Designer, television/film set of the [employer].

## 2015-02-26 NOTE — Telephone Encounter (Signed)
PLEASE CALL PT. HIS CXR IS NORMAL. HIS LIVER ENZYMES ARE NORMAL. HE NEEDS TO PICK UP A DISC WITH HIS IMAGES TO TAKE WITH HIM TO HIS APPT AT [facility].

## 2015-02-26 NOTE — H&P (View-Only) (Signed)
   Subjective:    Patient ID: Donald Mason, male    DOB: 02-22-1955, 60 y.o.   MRN: [id number]  [name], MD  HPI No headache powder use. USES TYLENOL OR IBUPROFEN PRN FOR PAIN. RECTAL BLEEDING FOR PAST 2-3 MOS. HAS BLOOD CLOTS 10-15 TIMES A DAY. NO STRAINING BUT WHEN HAS BM DOESN'T REALLY GET BETTER. HARD TO SIT AND DRIVE. UNEMPLOYED RIGHT NOW. NO HEAVY LIFTING. 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 HAS HAD SOME BLACK STOOLS. HAS HAD BLOOD LEAK OUT. BEFORE ALL THIS STARTED AND NL FORMED STOOL 3-4 TIMES A DAY.  LOWER ABDOMINAL CRAMPING/LUQ BEFORE SOME BLOODY STOOLS.  WEIGHT LOSS: 272 TO 214 AND NOW BACK UP 244 LBS TODAY. APPETITE: WAS DOWN BUT NOW BACK TO NORMAL.  PT DENIES FEVER, CHILLS, nausea, vomiting, diarrhea, CHEST PAIN, SHORTNESS OF BREATH, CHANGE IN BOWEL IN HABITS, constipation, problems swallowing, OR heartburn or indigestion.  Past Medical History  Diagnosis Date  . Hypertension   . CHF (congestive heart failure) ([place])   . Chronic groin pain   . Diabetes mellitus without complication ([place])   . Hypercholesterolemia    Past Surgical History  Procedure Laterality Date  . Leg surgery    . Arm surgery      No Known Allergies  Current Outpatient Prescriptions  Medication Sig Dispense Refill  . acetaminophen (TYLENOL) 500 MG tablet Take 1,000 mg by mouth every 6 (six) hours as needed for mild pain.    . furosemide (LASIX) 40 MG tablet Take 40 mg by mouth daily.     Marland Kitchen ibuprofen (ADVIL,MOTRIN) 200 MG tablet Take 600-1,000 mg by mouth every 6 (six) hours as needed for moderate pain.    Marland Kitchen lisinopril (PRINIVIL,ZESTRIL) 20 MG tablet Take 20 mg by mouth daily.    . metFORMIN (GLUCOPHAGE) 500 MG tablet Take 500 mg by mouth daily.     . Omega-3 Fatty Acids (FISH OIL PO) Take 2 capsules by mouth daily.    . potassium chloride (K-DUR) 10 MEQ tablet Take 10 mEq by mouth daily.    .       Family History  Problem Relation Age of Onset  . Pancreatic cancer Neg Hx   . Stomach cancer  Neg Hx   . Ulcerative colitis Neg Hx   . Crohn's disease Neg Hx   . Colon polyps Brother   . Colon cancer Paternal Grandmother     OVER AGE 40   Social History   Social History  . Marital Status: Single    Spouse Name: N/A  . Number of Children: N/A  . Years of Education: N/A   Social History Main Topics  . Smoking status: Former Research scientist (life sciences)  . Smokeless tobacco: Current User    Types: Snuff  . Alcohol Use: Yes     Comment: quit 3-4 years ago.  Recovering alcoholic  . Drug Use: No  . Sexual Activity: Not Asked   Other Topics Concern  . 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 2 KIDS: [name](32), [name](29).    Review of Systems PER HPI OTHERWISE ALL SYSTEMS ARE NEGATIVE.     Objective:   Physical Exam        Assessment & Plan:

## 2015-02-26 NOTE — Telephone Encounter (Signed)
I PERSONALLY REVIEWED THE CT WITH DR. Thornton Papas. INITIAL READ: NO MASS. UPON FURTHER REVIEW PT HAS THICKENED RECTAL WALL, POSSIBLE PELVIC LYMPHADENOPATHY, AND SINGLE LIVER MET.

## 2015-02-26 NOTE — Discharge Instructions (Signed)
YOU HAVE A MASS IN YOUR Rectum. You will need to have surgery to have it removed. YOU HAD 1 SMALL POLYP REMOVED. YOU do not HAVE HEMORRHOIDS.   YOUR BIOPSY WILL BE BACK on FRI OR MON.  YOU NEED A LABS, & A CHEST X-RAY.  YOU NEED A FULL COLONOSCOPY IN 6 mos to 1 YEAR.  YOUR SISTERS, BROTHERS, CHILDREN, AND PARENTS NEED TO HAVE A COLONOSCOPY STARTING AT THE AGE OF 40 and then every 5 years    ENDOSCOPY Care After Read the instructions outlined below and refer to this sheet in the next week. These discharge instructions provide you with general information on caring for yourself after you leave the hospital. While your treatment has been planned according to the most current medical practices available, unavoidable complications occasionally occur. If you have any problems or questions after discharge, call DR. Kairos Panetta, [phone].  ACTIVITY  You may resume your regular activity, but move at a slower pace for the next 24 hours.   Take frequent rest periods for the next 24 hours.   Walking will help get rid of the air and reduce the bloated feeling in your belly (abdomen).   No driving for 24 hours (because of the medicine (anesthesia) used during the test).   You may shower.   Do not sign any important legal documents or operate any machinery for 24 hours (because of the anesthesia used during the test).    NUTRITION  Drink plenty of fluids.   You may resume your normal diet as instructed by your doctor.   Begin with a light meal and progress to your normal diet. Heavy or fried foods are harder to digest and may make you feel sick to your stomach (nauseated).   Avoid alcoholic beverages for 24 hours or as instructed.    MEDICATIONS  You may resume your normal medications.   WHAT YOU CAN EXPECT TODAY  Some feelings of bloating in the abdomen.   Passage of more gas than usual.   Spotting of blood in your stool or on the toilet paper     IF YOU HADBIOPSIES TAKEN   DURING THE SIGMOIDOSCOPY/UPPER ENDOSCOPY:  Eat a soft diet IF YOU HAVE NAUSEA, BLOATING, ABDOMINAL PAIN, OR VOMITING.    FINDING OUT THE RESULTS OF YOUR TEST Not all test results are available during your visit. DR. Oneida Alar WILL CALL YOU WITHIN 5 DAYS OF YOUR PROCEDUE WITH YOUR RESULTS. Do not assume everything is normal if you have not heard from DR. Happy Ky, CALL HER OFFICE AT [phone].  SEEK IMMEDIATE MEDICAL ATTENTION AND CALL THE OFFICE: [phone] IF:  You have more than a spotting of blood in your stool.   Your belly is swollen (abdominal distention).   You are nauseated or vomiting.   You have a temperature over 101F.   You have abdominal pain or discomfort that is severe or gets worse throughout the day.  High-Fiber Diet A high-fiber diet changes your normal diet to include more whole grains, legumes, fruits, and vegetables. Changes in the diet involve replacing refined carbohydrates with unrefined foods. The calorie level of the diet is essentially unchanged. The Dietary Reference Intake (recommended amount) for adult males is 38 grams per day. For adult females, it is 25 grams per day. Pregnant and lactating women should consume 28 grams of fiber per day. Fiber is the intact part of a plant that is not broken down during digestion. Functional fiber is fiber that has been isolated from the plant  to provide a beneficial effect in the body. PURPOSE  Increase stool bulk.   Ease and regulate bowel movements.   Lower cholesterol.  REDUCE RISK OF COLON CANCER  INDICATIONS THAT YOU NEED MORE FIBER  Constipation and hemorrhoids.   Uncomplicated diverticulosis (intestine condition) and irritable bowel syndrome.   Weight management.   As a protective measure against hardening of the arteries (atherosclerosis), diabetes, and cancer.   GUIDELINES FOR INCREASING FIBER IN THE DIET  Start adding fiber to the diet slowly. A gradual increase of about 5 more grams (2 slices of  whole-wheat bread, 2 servings of most fruits or vegetables, or 1 bowl of high-fiber cereal) per day is best. Too rapid an increase in fiber may result in constipation, flatulence, and bloating.   Drink enough water and fluids to keep your urine clear or pale yellow. Water, juice, or caffeine-free drinks are recommended. Not drinking enough fluid may cause constipation.   Eat a variety of high-fiber foods rather than one type of fiber.   Try to increase your intake of fiber through using high-fiber foods rather than fiber pills or supplements that contain small amounts of fiber.   The goal is to change the types of food eaten. Do not supplement your present diet with high-fiber foods, but replace foods in your present diet.   INCLUDE A VARIETY OF FIBER SOURCES  Replace refined and processed grains with whole grains, canned fruits with fresh fruits, and incorporate other fiber sources. White rice, white breads, and most bakery goods contain little or no fiber.   Brown whole-grain rice, buckwheat oats, and many fruits and vegetables are all good sources of fiber. These include: broccoli, Brussels sprouts, cabbage, cauliflower, beets, sweet potatoes, white potatoes (skin on), carrots, tomatoes, eggplant, squash, berries, fresh fruits, and dried fruits.   Cereals appear to be the richest source of fiber. Cereal fiber is found in whole grains and bran. Bran is the fiber-rich outer coat of cereal grain, which is largely removed in refining. In whole-grain cereals, the bran remains. In breakfast cereals, the largest amount of fiber is found in those with "bran" in their names. The fiber content is sometimes indicated on the label.   You may need to include additional fruits and vegetables each day.   In baking, for 1 cup white flour, you may use the following substitutions:   1 cup whole-wheat flour minus 2 tablespoons.   1/2 cup white flour plus 1/2 cup whole-wheat flour.  Polyps, Colon  A polyp  is extra tissue that grows inside your body. Colon polyps grow in the large intestine. The large intestine, also called the colon, is part of your digestive system. It is a long, hollow tube at the end of your digestive tract where your body makes and stores stool. Most polyps are not dangerous. They are benign. This means they are not cancerous. But over time, some types of polyps can turn into cancer. Polyps that are smaller than a pea are usually not harmful. But larger polyps could someday become or may already be cancerous. To be safe, doctors remove all polyps and test them.   WHO GETS POLYPS? Anyone can get polyps, but certain people are more likely than others. You may have a greater chance of getting polyps if:  You are over 50.   You have had polyps before.   Someone in your family has had polyps.   Someone in your family has had cancer of the large intestine.   Find  out if someone in your family has had polyps. You may also be more likely to get polyps if you:   Eat a lot of fatty foods   Smoke   Drink alcohol   Do not exercise  Eat too much    PREVENTION There is not one sure way to prevent polyps. You might be able to lower your risk of getting them if you:  Eat more fruits and vegetables and less fatty food.   Do not smoke.   Avoid alcohol.   Exercise every day.   Lose weight if you are overweight.   Eating more calcium and folate can also lower your risk of getting polyps. Some foods that are rich in calcium are milk, cheese, and broccoli. Some foods that are rich in folate are chickpeas, kidney beans, and spinach.

## 2015-02-27 LAB — GLUCOSE, CAPILLARY: GLUCOSE-CAPILLARY: 105 mg/dL — AB (ref 65–99)

## 2015-02-27 LAB — CEA: CEA: 1.4 ng/mL (ref 0.0–4.7)

## 2015-02-28 ENCOUNTER — Telehealth: Payer: Self-pay

## 2015-02-28 ENCOUNTER — Other Ambulatory Visit: Payer: Self-pay

## 2015-02-28 ENCOUNTER — Encounter (HOSPITAL_COMMUNITY): Payer: Self-pay | Admitting: Gastroenterology

## 2015-02-28 DIAGNOSIS — K6289 Other specified diseases of anus and rectum: Secondary | ICD-10-CM

## 2015-02-28 MED ORDER — IBUPROFEN 600 MG PO TABS: 600.0000 mg | ORAL_TABLET | Freq: Three times a day (TID) | ORAL | Status: AC

## 2015-02-28 MED ORDER — CLARITHROMYCIN 500 MG PO TABS: 500.0000 mg | ORAL_TABLET | Freq: Two times a day (BID) | ORAL | Status: DC

## 2015-02-28 MED ORDER — AMOXICILLIN 500 MG PO TABS: ORAL_TABLET | ORAL | Status: DC

## 2015-02-28 MED ORDER — HYDROCODONE-ACETAMINOPHEN 5-325 MG PO TABS: ORAL_TABLET | ORAL | Status: DC

## 2015-02-28 NOTE — Telephone Encounter (Addendum)
Called patient TO DISCUSS RESULTS. USING ADVIL FOR PAIN IN RECTUM AND STOMACH AREA. 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 HE NEEDS AMOXICILLIN 500 MG 2 PO BID FOR 10 DAYS AND BIAXIN 500 MG PO BID FOR 10 DAYS. ADD omeprazole 20 mg bid for 30 DAYS THEN ONCE A DAY. Meds may cause NAUSEA, VOMITING, DIARRHEA, BLACK STOOLS, red rash, and ABDOMINAL cramps. PT MAY PICK UP RX FOR HYDROCODONE/ACETAMINOPHEN PRN. USE IBUPROFEN 600 MG TID WITH FOOD OR MILK.   NEEDS FLEX SIG IN 6 MOS. NEXT TCS IN JAN 2018.

## 2015-02-28 NOTE — Telephone Encounter (Signed)
Referral made and clinical faxed to [facility].    Pt is be seen by financial counseling prior to office consult

## 2015-02-28 NOTE — Telephone Encounter (Signed)
Pt girlfriend Jana Half  called and states that pt is having pain and the tylenol/advil is not helping. Is requesting something for pt.   States they use [facility] in [place].    Pt is aware of referral to [facility] and is aware of finance counceling to be in contact.  They can be reached at [phone]  They are aware that the office closes at 12:00pm

## 2015-03-03 ENCOUNTER — Other Ambulatory Visit: Payer: Self-pay

## 2015-03-03 MED ORDER — OMEPRAZOLE 20 MG PO CPDR: 20.0000 mg | DELAYED_RELEASE_CAPSULE | Freq: Two times a day (BID) | ORAL | Status: DC

## 2015-03-03 NOTE — Telephone Encounter (Signed)
Reminder in epic °

## 2015-03-03 NOTE — Telephone Encounter (Signed)
Pt is aware.  

## 2015-03-06 NOTE — Telephone Encounter (Signed)
noted 

## 2015-03-06 NOTE — Telephone Encounter (Signed)
Papers were faxed and mailed. Copy made to be scanned into chart

## 2015-03-06 NOTE — Telephone Encounter (Signed)
Paperwork for SS COMPLETE.

## 2015-03-07 ENCOUNTER — Other Ambulatory Visit: Payer: Self-pay | Admitting: Gastroenterology

## 2015-03-07 ENCOUNTER — Telehealth: Payer: Self-pay | Admitting: Gastroenterology

## 2015-03-07 MED ORDER — OXYCODONE-ACETAMINOPHEN 5-325 MG PO TABS: 1.0000 | ORAL_TABLET | Freq: Four times a day (QID) | ORAL | Status: DC | PRN

## 2015-03-07 NOTE — Telephone Encounter (Addendum)
Telephone call from Dr. Cheryll Cockayne today regarding patient. Patient was in his office being seen today for invasive adenocarcinoma of the colon.  Dr. Morton Stall was concerned about pathology report. Specimen #1 labeled as colon polyps, rectal mass and cecal polyp. Given multiple specimens were placed in the same bottle it is not clear which specimen had invasive adenocarcinoma and which was a tubular adenoma. He states he cannot operate on this patient based on this path report.  Dr. Morton Stall told the patient today that he will need to have another complete colonoscopy to clear the cecum and to document that the adenocarcinoma is coming from the rectal mass. He does not have time to re-scope the patient in a timely fashion and would like Dr. fields to do it.  Dr. Morton Stall would like Dr. Oneida Alar to call him if possible by Monday utilizing PAL line  to page him or cell phone [phone].  I have spoken with Donald Mason and made aware of concerns.

## 2015-03-10 ENCOUNTER — Other Ambulatory Visit: Payer: Self-pay

## 2015-03-10 DIAGNOSIS — K625 Hemorrhage of anus and rectum: Secondary | ICD-10-CM

## 2015-03-10 NOTE — Telephone Encounter (Signed)
LMOM to call back

## 2015-03-10 NOTE — Telephone Encounter (Signed)
Per Dr. Oneida Alar please reschedule patient for a tcs this week during her lunch hour.  Also, please make sure we provide the patient with a sample prep from our office.

## 2015-03-11 NOTE — Telephone Encounter (Signed)
Pt came by the office to pick up prep and instructions.

## 2015-03-12 NOTE — Telephone Encounter (Signed)
REVIEWED-NO ADDITIONAL RECOMMENDATIONS. 

## 2015-03-14 ENCOUNTER — Ambulatory Visit (HOSPITAL_COMMUNITY)
Admission: RE | Admit: 2015-03-14 | Discharge: 2015-03-14 | Disposition: A | Discharge status: Home / Self Care | Payer: Medicaid Other | Source: Ambulatory Visit | Attending: Gastroenterology | Admitting: Gastroenterology

## 2015-03-14 ENCOUNTER — Encounter (HOSPITAL_COMMUNITY)
Admission: RE | Disposition: A | Discharge status: Home / Self Care | Payer: Self-pay | Source: Ambulatory Visit | Attending: Gastroenterology

## 2015-03-14 ENCOUNTER — Encounter (HOSPITAL_COMMUNITY): Payer: Self-pay | Admitting: *Deleted

## 2015-03-14 DIAGNOSIS — I110 Hypertensive heart disease with heart failure: Secondary | ICD-10-CM | POA: Insufficient documentation

## 2015-03-14 DIAGNOSIS — K625 Hemorrhage of anus and rectum: Secondary | ICD-10-CM

## 2015-03-14 DIAGNOSIS — Z7984 Long term (current) use of oral hypoglycemic drugs: Secondary | ICD-10-CM | POA: Insufficient documentation

## 2015-03-14 DIAGNOSIS — Z87891 Personal history of nicotine dependence: Secondary | ICD-10-CM | POA: Insufficient documentation

## 2015-03-14 DIAGNOSIS — E119 Type 2 diabetes mellitus without complications: Secondary | ICD-10-CM | POA: Insufficient documentation

## 2015-03-14 DIAGNOSIS — C20 Malignant neoplasm of rectum: Secondary | ICD-10-CM | POA: Insufficient documentation

## 2015-03-14 DIAGNOSIS — E7800 Pure hypercholesterolemia, unspecified: Secondary | ICD-10-CM | POA: Insufficient documentation

## 2015-03-14 HISTORY — PX: COLONOSCOPY: SHX5424

## 2015-03-14 LAB — GLUCOSE, CAPILLARY: GLUCOSE-CAPILLARY: 111 mg/dL — AB (ref 65–99)

## 2015-03-14 SURGERY — COLONOSCOPY
Anesthesia: Moderate Sedation

## 2015-03-14 MED ORDER — STERILE WATER FOR IRRIGATION IR SOLN
Status: DC | PRN
  Administered 2015-03-14: 2.5 mL

## 2015-03-14 MED ORDER — OXYCODONE-ACETAMINOPHEN 5-325 MG PO TABS: 1.0000 | ORAL_TABLET | Freq: Four times a day (QID) | ORAL | Status: DC | PRN

## 2015-03-14 MED ORDER — MIDAZOLAM HCL 5 MG/5ML IJ SOLN
INTRAMUSCULAR | Status: AC
  Filled 2015-03-14: qty 10

## 2015-03-14 MED ORDER — MIDAZOLAM HCL 5 MG/5ML IJ SOLN
INTRAMUSCULAR | Status: DC | PRN
  Administered 2015-03-14 (×2): 2 mg via INTRAVENOUS

## 2015-03-14 MED ORDER — SODIUM CHLORIDE 0.9 % IV SOLN
INTRAVENOUS | Status: DC
  Administered 2015-03-14: 07:00:00 via INTRAVENOUS

## 2015-03-14 MED ORDER — MEPERIDINE HCL 100 MG/ML IJ SOLN
INTRAMUSCULAR | Status: DC | PRN
  Administered 2015-03-14 (×2): 50 mg via INTRAVENOUS

## 2015-03-14 MED ORDER — SPOT INK MARKER SYRINGE KIT
PACK | SUBMUCOSAL | Status: DC | PRN
  Administered 2015-03-14: 3 mL via SUBMUCOSAL

## 2015-03-14 MED ORDER — MEPERIDINE HCL 100 MG/ML IJ SOLN
INTRAMUSCULAR | Status: AC
  Filled 2015-03-14: qty 2

## 2015-03-14 NOTE — H&P (Signed)
Primary Care Physician:  Glenda Chroman., MD Primary Gastroenterologist:  Dr. Oneida Alar  Pre-Procedure History & Physical: HPI:  Donald Mason is a 60 y.o. male here for RECTAL MASS.  Past Medical History  Diagnosis Date  . Hypertension   . CHF (congestive heart failure) ([place])   . Chronic groin pain   . Diabetes mellitus without complication ([place])   . Hypercholesterolemia   . Cancer [facility])     rectal mass    Past Surgical History  Procedure Laterality Date  . Leg surgery    . Arm surgery    . Tonsillectomy    . Colonoscopy N/A 02/26/2015    Procedure: COLONOSCOPY;  Surgeon: Danie Binder, MD;  Location: [facility];  Service: Endoscopy;  Laterality: N/A;  1245   . Esophagogastroduodenoscopy N/A 02/26/2015    Procedure: ESOPHAGOGASTRODUODENOSCOPY (EGD);  Surgeon: Danie Binder, MD;  Location: [facility] ENDO SUITE;  Service: Endoscopy;  Laterality: N/A;    Prior to Admission medications   Medication Sig Start Date End Date Taking? Authorizing Provider  amoxicillin (AMOXIL) 500 MG tablet 2 PO BID FOR 10 DAYS 02/28/15  Yes Danie Binder, MD  clarithromycin (BIAXIN) 500 MG tablet Take 1 tablet (500 mg total) by mouth 2 (two) times daily. FOR 10 DAYS 02/28/15  Yes Danie Binder, MD  furosemide (LASIX) 40 MG tablet Take 40 mg by mouth daily.    Yes Historical Provider, MD  ibuprofen (ADVIL,MOTRIN) 600 MG tablet Take 1 tablet (600 mg total) by mouth 3 (three) times daily. WITH FOOD OR MILK 02/28/15  Yes Danie Binder, MD  lisinopril (PRINIVIL,ZESTRIL) 20 MG tablet Take 20 mg by mouth daily.   Yes Historical Provider, MD  metFORMIN (GLUCOPHAGE) 500 MG tablet Take 500 mg by mouth daily.    Yes Historical Provider, MD  Omega-3 Fatty Acids (FISH OIL PO) Take 2 capsules by mouth daily.   Yes Historical Provider, MD  omeprazole (PRILOSEC) 20 MG capsule Take 1 capsule (20 mg total) by mouth 2 (two) times daily before a meal. Take 20mg   BID for 30 days  Then take once daily 03/03/15  Yes Danie Binder, MD  oxyCODONE-acetaminophen (PERCOCET) 5-325 MG tablet Take 1 tablet by mouth every 6 (six) hours as needed for severe pain. 03/07/15  Yes Orvil Feil, NP  potassium chloride (K-DUR) 10 MEQ tablet Take 10 mEq by mouth daily.   Yes Historical Provider, MD  acetaminophen (TYLENOL) 500 MG tablet Take 1,000 mg by mouth every 6 (six) hours as needed for mild pain.    Historical Provider, MD  HYDROcodone-acetaminophen (NORCO/VICODIN) 5-325 MG tablet 1 OR 2 EVERY 6 H PRN FOR PAIN Patient not taking: Reported on 03/11/2015 02/28/15   Danie Binder, MD    Allergies as of 03/10/2015  . (No Known Allergies)    Family History  Problem Relation Age of Onset  . Pancreatic cancer Neg Hx   . Stomach cancer Neg Hx   . Ulcerative colitis Neg Hx   . Crohn's disease Neg Hx   . Colon cancer Paternal Grandmother     OVER AGE 70  . Cancer Mother   . Colon polyps Sister   . Leukemia Sister     Social History   Social History  . Marital Status: Single    Spouse Name: N/A  . Number of Children: N/A  . Years of Education: N/A   Occupational History  . Not on file.   Social History Main Topics  .  Smoking status: Former Smoker -- 1.00 packs/day for 30 years  . Smokeless tobacco: Current User    Types: Snuff  . Alcohol Use: Yes     Comment: quit 3-4 years ago.  Recovering alcoholic  . Drug Use: No  . Sexual Activity: Not on file   Other Topics Concern  . 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 2 KIDS: Donald Mason(32), Donald Mason(29).    Review of Systems: See HPI, otherwise negative ROS   Physical Exam: BP 147/86 mmHg  Pulse 81  Temp(Src) 98.1 F (36.7 C) (Oral)  Resp 12  Ht 5\' 9"  (1.753 m)  Wt 240 lb (108.863 kg)  BMI 35.43 kg/m2  SpO2 98% General:   Alert,  pleasant and cooperative in NAD Head:  Normocephalic and atraumatic. Neck:  Supple; Lungs:  Clear throughout to auscultation.    Heart:  Regular  rate and rhythm. Abdomen:  Soft, nontender and nondistended. Normal bowel sounds, without guarding, and without rebound.   Neurologic:  Alert and  oriented x4;  grossly normal neurologically.  Impression/Plan:    RECTAL MASS PLAN: 1. TCS TODAY

## 2015-03-14 NOTE — Discharge Instructions (Signed)
YOU HAVE A MASS IN YOUR rectum. YOU DO NOT HAVE A MASS OR POLYPS IN YOUR RIGHT COLON.   YOUR BIOPSY RESULTS WILL BE AVAILABLE IN MY CHART JAN 31 AND MY OFFICE WILL CONTACT YOU IN 10-14 DAYS WITH YOUR RESULTS.    ENDOSCOPY Care After Read the instructions outlined below and refer to this sheet in the next week. These discharge instructions provide you with general information on caring for yourself after you leave the hospital. While your treatment has been planned according to the most current medical practices available, unavoidable complications occasionally occur. If you have any problems or questions after discharge, call DR. Emery Dupuy, [phone].  ACTIVITY  You may resume your regular activity, but move at a slower pace for the next 24 hours.   Take frequent rest periods for the next 24 hours.   Walking will help get rid of the air and reduce the bloated feeling in your belly (abdomen).   No driving for 24 hours (because of the medicine (anesthesia) used during the test).   You may shower.   Do not sign any important legal documents or operate any machinery for 24 hours (because of the anesthesia used during the test).    NUTRITION  Drink plenty of fluids.   You may resume your normal diet as instructed by your doctor.   Begin with a light meal and progress to your normal diet. Heavy or fried foods are harder to digest and may make you feel sick to your stomach (nauseated).   Avoid alcoholic beverages for 24 hours or as instructed.    MEDICATIONS  You may resume your normal medications.   WHAT YOU CAN EXPECT TODAY  Some feelings of bloating in the abdomen.   Passage of more gas than usual.   Spotting of blood in your stool or on the toilet paper     IF YOU HADBIOPSIES TAKEN  DURING THE SIGMOIDOSCOPY/UPPER ENDOSCOPY:  Eat a soft diet IF YOU HAVE NAUSEA, BLOATING, ABDOMINAL PAIN, OR VOMITING.    FINDING OUT THE RESULTS OF YOUR TEST Not all test results are  available during your visit. DR. Oneida Alar WILL CALL YOU WITHIN 7 DAYS OF YOUR PROCEDUE WITH YOUR RESULTS. Do not assume everything is normal if you have not heard from DR. Annora Guderian IN ONE WEEK, CALL HER OFFICE AT [phone].  SEEK IMMEDIATE MEDICAL ATTENTION AND CALL THE OFFICE: [phone] IF:  You have more than a spotting of blood in your stool.   Your belly is swollen (abdominal distention).   You are nauseated or vomiting.   You have a temperature over 101F.   You have abdominal pain or discomfort that is severe or gets worse throughout the day.

## 2015-03-14 NOTE — Op Note (Signed)
[facility] [address] [place], [address]   COLONOSCOPY PROCEDURE REPORT  PATIENT: Donald Mason, Donald Mason  MR#: [id number] BIRTHDATE: Dec 14, 1955 , 20  yrs. old GENDER: male ENDOSCOPIST: Danie Binder, MD REFERRED [id number] Woody Seller, M.D.  Cheryll Cockayne, M.D. 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 MEDICATIONS: Demerol 100 mg IV and Versed 4 mg IV  DESCRIPTION OF PROCEDURE:    Physical exam was performed.  Informed consent was obtained from the patient after explaining the benefits, risks, and alternatives to procedure.  The patient was connected to monitor and placed in left lateral position. Continuous oxygen was provided by nasal cannula and IV medicine administered through an indwelling cannula.  After administration of sedation and rectal exam, the patients rectum was intubated and the [id number]  colonoscope was advanced under direct visualization to the cecum.  The scope was removed slowly by carefully examining the color, texture, anatomy, and integrity mucosa on the way out.  The patient was recovered in endoscopy and discharged home in satisfactory condition. Estimated blood loss is zero unless otherwise noted in this procedure report.    COLON FINDINGS: NORMAL CECUM.  FOUR QUADRANT BIOPSIES OBTAINED IN THE CECUM VIA COLD FORCEPS(BOTTLE #1). NEAR CIRCUMFERENTIAL RECTAL MASS STARTING 10 CM FROM THE ANAL VERGE AND EXTENDING TO 20 CM FROM THE ANAL VERGE.  COLD FORCEPS BIOPSIES OBTAINED OF RECTL MASS(BOTTLE #2).  1 CC SPOT TATTOO INJECTED PROXIMAL AND DISTAL TO MASS.-TOTAL: 2 CC. Internal hemorrhoids were found.  PREP QUALITY: good.  CECAL W/D TIME: 12       minutes COMPLICATIONS: None  ENDOSCOPIC IMPRESSION: 1.   NORMAL CECUM.  FOUR QUADRANT BIOPSIES OBTAINED VIA COLD FORCEPS  2.   RECTAL MASS  STARTING 10 CM FROM THE ANAL VERGE AND EXTENDING TO 20 CM FROM THE ANAL VERGE.  COLD FORCEPS BIOPSIES OBTAINED.  1 CC SPOT TATTOO INJECTED PROXIMAL AND DISTAL TO MASS 3.   Internal hemorrhoids  RECOMMENDATIONS: AWAIT BIOPSY PT GIVEN IMAGES AND REPORT. Rx FOR PERCOCET #30 GIVEN. FLEX SIG IN 6 MOS. TCS IN JAN 2018.  eSigned:  Danie Binder, MD 03-17-15 8:57 AM   CPT CODES: ICD CODES:  The ICD and CPT codes recommended by this software are interpretations from the data that the clinical staff has captured with the software.  The verification of the translation of this report to the ICD and CPT codes and modifiers is the sole responsibility of the health care institution and practicing physician where this report was generated.  [facility]. will not be held responsible for the validity of the ICD and CPT codes included on this report.  AMA assumes no liability for data contained or not contained herein. CPT is a Designer, television/film set of the [employer].  PATIENT NAME:  Donald, Mason MR#: [id number]

## 2015-03-17 ENCOUNTER — Encounter (HOSPITAL_COMMUNITY): Payer: Self-pay | Admitting: Gastroenterology

## 2015-03-31 ENCOUNTER — Telehealth: Payer: Self-pay | Admitting: Gastroenterology

## 2015-03-31 NOTE — Telephone Encounter (Signed)
PLEASE CALL PT. HIS R COLON BIOPSY ARE NORMAL. HIS RECTAL BIOPSIES SHOW CANCER. REPEAT TCS IN 6 MOS.

## 2015-04-01 NOTE — Telephone Encounter (Signed)
Reminder in epic °

## 2015-04-03 NOTE — Telephone Encounter (Signed)
LMOM to call.

## 2015-04-07 NOTE — Telephone Encounter (Signed)
LMOM to call. Letter mailed to call also.  

## 2015-04-08 NOTE — Telephone Encounter (Signed)
Pt is aware.  

## 2015-05-07 ENCOUNTER — Encounter: Payer: Self-pay | Admitting: Gastroenterology

## 2015-07-24 ENCOUNTER — Encounter: Payer: Self-pay | Admitting: Gastroenterology

## 2015-10-01 ENCOUNTER — Telehealth: Payer: Self-pay | Admitting: Gastroenterology

## 2015-10-01 NOTE — Telephone Encounter (Signed)
CONTACT PT FOR OPV AND DISCUSS NEED FOR REPEAT ENDOSCOPY.

## 2015-10-02 ENCOUNTER — Encounter: Payer: Self-pay | Admitting: Gastroenterology

## 2015-10-02 NOTE — Telephone Encounter (Signed)
APPT MADE AND LETTER SENT  °

## 2015-11-04 ENCOUNTER — Telehealth: Payer: Self-pay | Admitting: Gastroenterology

## 2015-11-04 NOTE — Telephone Encounter (Signed)
REVIEWED. NEXT TRIAGE FOR TCS JAN 2018.

## 2015-11-04 NOTE — Telephone Encounter (Signed)
Pt called to cancel his OV with [facility] tomorrow. It was to discuss a repeat endoscopy. Pt said he was still getting chemo and wanted to hold off on this for right now. Please advise when I should nic him for the recall list.

## 2015-11-04 NOTE — Telephone Encounter (Signed)
Reminder in epic °

## 2015-11-04 NOTE — Telephone Encounter (Signed)
Nic for Jan 2018 TCS.

## 2015-11-05 ENCOUNTER — Ambulatory Visit: Payer: Medicaid Other | Admitting: Gastroenterology

## 2015-11-05 NOTE — Telephone Encounter (Signed)
Noted! With my call list.

## 2016-01-27 ENCOUNTER — Telehealth: Payer: Self-pay | Admitting: Gastroenterology

## 2016-01-27 NOTE — Telephone Encounter (Signed)
LMOM to call.

## 2016-01-27 NOTE — Telephone Encounter (Signed)
ON RECALL FOR TRIAGE IN JAN 2018

## 2016-01-29 NOTE — Telephone Encounter (Signed)
Letter mailed to call.  

## 2016-02-16 HISTORY — PX: COLOSTOMY REVERSAL: SHX5782

## 2016-02-25 ENCOUNTER — Telehealth: Payer: Self-pay

## 2016-02-25 NOTE — Telephone Encounter (Signed)
Left Vm to call to schedule his next colonoscopy. ( Letter reminder was mailed in Dec 2017).

## 2016-03-09 ENCOUNTER — Encounter (HOSPITAL_COMMUNITY): Payer: Self-pay | Admitting: Emergency Medicine

## 2016-03-09 ENCOUNTER — Emergency Department (HOSPITAL_COMMUNITY): Payer: Medicaid Other

## 2016-03-09 ENCOUNTER — Emergency Department (HOSPITAL_COMMUNITY)
Admission: EM | Admit: 2016-03-09 | Discharge: 2016-03-10 | Disposition: A | Discharge status: Home / Self Care | Payer: Medicaid Other | Attending: Emergency Medicine | Admitting: Emergency Medicine

## 2016-03-09 DIAGNOSIS — Z7984 Long term (current) use of oral hypoglycemic drugs: Secondary | ICD-10-CM | POA: Diagnosis not present

## 2016-03-09 DIAGNOSIS — J181 Lobar pneumonia, unspecified organism: Secondary | ICD-10-CM | POA: Insufficient documentation

## 2016-03-09 DIAGNOSIS — I110 Hypertensive heart disease with heart failure: Secondary | ICD-10-CM | POA: Diagnosis not present

## 2016-03-09 DIAGNOSIS — Z79899 Other long term (current) drug therapy: Secondary | ICD-10-CM | POA: Diagnosis not present

## 2016-03-09 DIAGNOSIS — I509 Heart failure, unspecified: Secondary | ICD-10-CM | POA: Insufficient documentation

## 2016-03-09 DIAGNOSIS — Z87891 Personal history of nicotine dependence: Secondary | ICD-10-CM | POA: Diagnosis not present

## 2016-03-09 DIAGNOSIS — Z791 Long term (current) use of non-steroidal anti-inflammatories (NSAID): Secondary | ICD-10-CM | POA: Insufficient documentation

## 2016-03-09 DIAGNOSIS — J189 Pneumonia, unspecified organism: Secondary | ICD-10-CM

## 2016-03-09 DIAGNOSIS — E119 Type 2 diabetes mellitus without complications: Secondary | ICD-10-CM | POA: Diagnosis not present

## 2016-03-09 DIAGNOSIS — R05 Cough: Secondary | ICD-10-CM | POA: Diagnosis present

## 2016-03-09 NOTE — ED Triage Notes (Signed)
Pt c/o cough that is non productive, sob, wheezing that started a few days ago, denies any fever or chills,

## 2016-03-10 ENCOUNTER — Emergency Department (HOSPITAL_COMMUNITY): Payer: Medicaid Other

## 2016-03-10 LAB — I-STAT CHEM 8, ED
BUN: 4 mg/dL — AB (ref 6–20)
CHLORIDE: 100 mmol/L — AB (ref 101–111)
CREATININE: 0.8 mg/dL (ref 0.61–1.24)
Calcium, Ion: 1.18 mmol/L (ref 1.15–1.40)
Glucose, Bld: 123 mg/dL — ABNORMAL HIGH (ref 65–99)
HCT: 36 % — ABNORMAL LOW (ref 39.0–52.0)
Hemoglobin: 12.2 g/dL — ABNORMAL LOW (ref 13.0–17.0)
Potassium: 3.4 mmol/L — ABNORMAL LOW (ref 3.5–5.1)
SODIUM: 139 mmol/L (ref 135–145)
TCO2: 26 mmol/L (ref 0–100)

## 2016-03-10 MED ORDER — ALBUTEROL SULFATE (2.5 MG/3ML) 0.083% IN NEBU
2.5000 mg | INHALATION_SOLUTION | Freq: Once | RESPIRATORY_TRACT | Status: AC
  Administered 2016-03-10: 2.5 mg via RESPIRATORY_TRACT
  Filled 2016-03-10: qty 3

## 2016-03-10 MED ORDER — IPRATROPIUM BROMIDE 0.02 % IN SOLN
0.5000 mg | Freq: Once | RESPIRATORY_TRACT | Status: DC
Start: 2016-03-10 — End: 2016-03-10

## 2016-03-10 MED ORDER — IOPAMIDOL (ISOVUE-370) INJECTION 76%
100.0000 mL | Freq: Once | INTRAVENOUS | Status: AC | PRN
  Administered 2016-03-10: 100 mL via INTRAVENOUS

## 2016-03-10 MED ORDER — IPRATROPIUM-ALBUTEROL 0.5-2.5 (3) MG/3ML IN SOLN
3.0000 mL | Freq: Once | RESPIRATORY_TRACT | Status: AC
  Administered 2016-03-10: 3 mL via RESPIRATORY_TRACT
  Filled 2016-03-10: qty 3

## 2016-03-10 MED ORDER — LEVOFLOXACIN 750 MG PO TABS: 750.0000 mg | ORAL_TABLET | Freq: Every day | ORAL | 0 refills | Status: DC

## 2016-03-10 MED ORDER — IPRATROPIUM BROMIDE 0.02 % IN SOLN: 0.5000 mg | Freq: Once | RESPIRATORY_TRACT | Status: DC

## 2016-03-10 MED ORDER — ALBUTEROL SULFATE (2.5 MG/3ML) 0.083% IN NEBU: 5.0000 mg | INHALATION_SOLUTION | Freq: Once | RESPIRATORY_TRACT | Status: DC

## 2016-03-10 MED ORDER — AEROCHAMBER Z-STAT PLUS/MEDIUM MISC
1.0000 | Freq: Once | Status: DC
  Filled 2016-03-10: qty 1

## 2016-03-10 MED ORDER — LEVOFLOXACIN 750 MG PO TABS
750.0000 mg | ORAL_TABLET | Freq: Once | ORAL | Status: AC
  Administered 2016-03-10: 750 mg via ORAL
  Filled 2016-03-10: qty 1

## 2016-03-10 MED ORDER — ALBUTEROL SULFATE HFA 108 (90 BASE) MCG/ACT IN AERS
2.0000 | INHALATION_SPRAY | Freq: Four times a day (QID) | RESPIRATORY_TRACT | Status: DC | PRN
  Filled 2016-03-10: qty 6.7

## 2016-03-10 NOTE — ED Provider Notes (Signed)
[place] DEPT Provider Note   CSN: [id number] Arrival date & time: 03/09/16  2319   By signing my name below, I, Donald Mason, attest that this documentation has been prepared under the direction and in the presence of Donald Porter, MD. Electronically Signed: Hilbert Mason, Scribe. 03/10/16. 1:05 AM.  Time seen 12:23 AM  History   Chief Complaint Chief Complaint  Patient presents with  . Cough    The history is provided by the patient and the spouse. No language interpreter was used.     HPI Comments: Donald Mason is a 61 y.o. male who presents to the Emergency Department complaining of a non productive cough that began a few days ago. He reports associated SOB, dry throat, and wheezing. He states that he is unable to take deep breaths. He denies any fever and chills. He also denies, sneezing, rhinorrhea, diarrhea,nausea, and vomiting. He denies hx of smoking and alcohol use. He also denies hx of bronchitis and asthma. He denies taking the flu shot this year. He was  admitted for PNA at [facility] hospital in November for 2 days. At that time he was given nebulizers and that was the first time he had ever used them. He was not discharged home from the hospital on an inhaler. He is currently on  Xarelto for PE diagnosed at that time. He states he is taking his xarelto as prescribed. Patient had a chemo treatment performed at [facility] around 2 weeks ago.  PCP Donald Chroman, MD  Past Medical History:  Diagnosis Date  . Cancer ([place])    rectal mass  . CHF (congestive heart failure) ([place])   . Chronic groin pain   . Diabetes mellitus without complication ([place])   . Hypercholesterolemia   . Hypertension   . Ileostomy in place [facility])     Patient Active Problem List   Diagnosis Date Noted  . Rectal bleeding 02/06/2015    Past Surgical History:  Procedure Laterality Date  . arm surgery    . COLONOSCOPY N/A 02/26/2015   Procedure: COLONOSCOPY;  Surgeon: Danie Binder, MD;  Location: [facility] ENDO SU[facility];  Service: Endoscopy;  Laterality: N/A;  1245   . COLONOSCOPY N/A 03/14/2015   Procedure: COLONOSCOPY;  Surgeon: Danie Binder, MD;  Location: [facility] ENDO SUITE;  Service: Endoscopy;  Laterality: N/A;  730   . ESOPHAGOGASTRODUODENOSCOPY N/A 02/26/2015   Procedure: ESOPHAGOGASTRODUODENOSCOPY (EGD);  Surgeon: Danie Binder, MD;  Location: [facility] ENDO SU[facility];  Service: Endoscopy;  Laterality: N/A;  . LEG SURGERY    . TONSILLECTOMY         Home Medications    Prior to Admission medications   Medication Sig Start Date End Date Taking? Authorizing Provider  acetaminophen (TYLENOL) 500 MG tablet Take 1,000 mg by mouth every 6 (six) hours as needed for mild pain.    Historical Provider, MD  amoxicillin (AMOXIL) 500 MG tablet 2 PO BID FOR 10 DAYS 02/28/15   Danie Binder, MD  clarithromycin (BIAXIN) 500 MG tablet Take 1 tablet (500 mg total) by mouth 2 (two) times daily. FOR 10 DAYS 02/28/15   Danie Binder, MD  furosemide (LASIX) 40 MG tablet Take 40 mg by mouth daily.     Historical Provider, MD  ibuprofen (ADVIL,MOTRIN) 600 MG tablet Take 1 tablet (600 mg total) by mouth 3 (three) times daily. WITH FOOD OR MILK 02/28/15   Danie Binder, MD  levofloxacin (LEVAQUIN) 750 MG tablet Take 1 tablet (750  mg total) by mouth daily. 03/10/16   Donald Porter, MD  lisinopril (PRINIVIL,ZESTRIL) 20 MG tablet Take 20 mg by mouth daily.    Historical Provider, MD  metFORMIN (GLUCOPHAGE) 500 MG tablet Take 500 mg by mouth daily.     Historical Provider, MD  Omega-3 Fatty Acids (FISH OIL PO) Take 2 capsules by mouth daily.    Historical Provider, MD  omeprazole (PRILOSEC) 20 MG capsule Take 1 capsule (20 mg total) by mouth 2 (two) times daily before a meal. Take 20mg   BID for 30 days  Then take once daily 03/03/15   Danie Binder, MD  oxyCODONE-acetaminophen (PERCOCET) 5-325 MG tablet Take 1 tablet by mouth every 6 (six) hours as needed for severe pain. 03/14/15   Danie Binder, MD    potassium chloride (K-DUR) 10 MEQ tablet Take 10 mEq by mouth daily.    Historical Provider, MD    Family History Family History  Problem Relation Age of Onset  . Cancer Mother   . Colon polyps Sister   . Leukemia Sister   . Colon cancer Paternal Grandmother     OVER AGE 32  . Pancreatic cancer Neg Hx   . Stomach cancer Neg Hx   . Ulcerative colitis Neg Hx   . Crohn's disease Neg Hx     Social History Social History  Substance Use Topics  . Smoking status: Former Smoker    Packs/day: 1.00    Years: 30.00  . Smokeless tobacco: Former Systems developer    Types: Snuff  . Alcohol use Yes     Comment: quit 3-4 years ago.  Recovering alcoholic  unemployed   Allergies   Patient has no known allergies.   Review of Systems Review of Systems  Constitutional: Negative for chills and fever.  HENT: Positive for sore throat. Negative for rhinorrhea and sneezing.   Respiratory: Positive for cough and wheezing.   Gastrointestinal: Negative for abdominal pain and vomiting.  All other systems reviewed and are negative.    Physical Exam Updated Vital Signs BP 114/74 (BP Location: Left Arm)   Pulse 86   Temp 98.1 F (36.7 C)   Resp 18   Ht 5\' 9"  (1.753 m)   Wt 200 lb (90.7 kg)   SpO2 98%   BMI 29.53 kg/m   Vital signs normal    Physical Exam  Constitutional: He is oriented to person, place, and time. He appears well-developed and well-nourished.  Non-toxic appearance. He does not appear ill. No distress.  HENT:  Head: Normocephalic and atraumatic.  Right Ear: External ear normal.  Left Ear: External ear normal.  Nose: Nose normal. No mucosal edema or rhinorrhea.  Mouth/Throat: Oropharynx is clear and moist and mucous membranes are normal. No dental abscesses or uvula swelling.  Eyes: Conjunctivae and EOM are normal. Pupils are equal, round, and reactive to light.  Neck: Normal range of motion and full passive range of motion without pain. Neck supple.  Cardiovascular: Normal  rate, regular rhythm and normal heart sounds.  Exam reveals no gallop and no friction rub.   No murmur heard. Pulmonary/Chest: Effort normal. No respiratory distress. He has decreased breath sounds. He has no wheezes. He has no rhonchi. He has no rales. He exhibits no tenderness and no crepitus.  Abdominal: Soft. Normal appearance and bowel sounds are normal. He exhibits no distension. There is no tenderness. There is no rebound and no guarding.  Musculoskeletal: Normal range of motion. He exhibits no edema or tenderness.  Moves all extremities well.   Neurological: He is alert and oriented to person, place, and time. He has normal strength. No cranial nerve deficit.  Skin: Skin is warm, dry and intact. No rash noted. No erythema. No pallor.  Psychiatric: He has a normal mood and affect. His speech is normal and behavior is normal. His mood appears not anxious.  Nursing note and vitals reviewed.    ED Treatments / Results  DIAGNOSTIC STUDIES: Oxygen Saturation is 98% on RA, normal by my interpretation.      Labs (all labs ordered are listed, but only abnormal results are displayed) Results for orders placed or performed during the hospital encounter of 03/09/16  I-stat Chem 8, ED  Result Value Ref Range   Sodium 139 135 - 145 mmol/L   Potassium 3.4 (L) 3.5 - 5.1 mmol/L   Chloride 100 (L) 101 - 111 mmol/L   BUN 4 (L) 6 - 20 mg/dL   Creatinine, Ser 0.80 0.61 - 1.24 mg/dL   Glucose, Bld 123 (H) 65 - 99 mg/dL   Calcium, Ion 1.18 1.15 - 1.40 mmol/L   TCO2 26 0 - 100 mmol/L   Hemoglobin 12.2 (L) 13.0 - 17.0 g/dL   HCT 36.0 (L) 39.0 - 52.0 %   Laboratory interpretation all normal except mild hypokalemia    EKG  EKG Interpretation None       Radiology Dg Chest 2 View  Result Date: 03/09/2016 CLINICAL DATA:  61 year old male with cough and wheezing. EXAM: CHEST  2 VIEW COMPARISON:  Chest CT dated 01/12/2016 FINDINGS: There are bibasilar atelectatic changes/ scarring. There is  no focal consolidation, pleural effusion, or pneumothorax. The cardiac silhouette is within normal limits. Right sided Port-A-Cath with tip over central SVC near the cavoatrial junction. There is anterior wedging of the mid thoracic spine related to hemivertebra seen on the prior CT. There is resulting kyphosis similar to prior CT. No acute osseous pathology. IMPRESSION: No active cardiopulmonary disease. Electronically Signed   By: Anner Crete M.D.   On: 03/09/2016 23:57   Ct Angio Chest Pe W/cm &/or Wo Cm  Result Date: 03/10/2016 CLINICAL DATA:  61 year old male with history of recent PE presenting with shortness of breath and nonproductive cough. EXAM: CT ANGIOGRAPHY CHEST WITH CONTRAST TECHNIQUE: Multidetector CT imaging of the chest was performed using the standard protocol during bolus administration of intravenous contrast. Multiplanar CT image reconstructions and MIPs were obtained to evaluate the vascular anatomy. CONTRAST:  100 cc Isovue 370 COMPARISON:  Chest radiograph dated 03/09/2016 FINDINGS: Cardiovascular: There is no cardiomegaly or pericardial effusion. The thoracic aorta appears unremarkable. The origins of the great vessels of the aortic arch appear patent. Evaluation of the pulmonary arteries is limited due to suboptimal opacification. No definite pulmonary artery embolus identified. Mediastinum/Nodes: No hilar or mediastinal adenopathy. The esophagus is grossly unremarkable. No thyroid nodules identified. Lungs/Pleura: Right lung base linear atelectasis/ scarring. An area of subpleural linear and ground-glass density and adjacent 6 mm nodule in the lump on posterior segment of the right upper lobe (series 4, image 29) likely sequela of prior infection and scarring. Follow-up recommended. There is a cluster of faint nodular density in the right upper lobe (series 4 image 36 and 37) likely infectious in etiology and concerning for pneumonia. There is no focal consolidation. No pleural  effusion or pneumothorax. The central airways are patent. Upper Abdomen: Apparent diffuse fatty infiltration of the liver. The visualized upper abdomen is otherwise unremarkable. Musculoskeletal: Degenerative changes of the  spine. 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 No acute fracture. Review of the MIP images confirms the above findings. IMPRESSION: No CT evidence of pulmonary embolism. Cluster of faint nodular density in the right upper lobe concerning for pneumonia. Clinical correlation and follow-up recommended. Small subpleural linear and ground-glass density and associated 6 mm nodule in the posterior segment of the right upper lobe likely related to sequela of prior infection and scarring. Non-contrast chest CT at 6-12 months is recommended. If the nodule is stable at time of repeat CT, then future CT at 18-24 months (from today's scan) is considered optional for low-risk patients, but is recommended for high-risk patients. This recommendation follows the consensus statement: Guidelines for Management of Incidental Pulmonary Nodules Detected on CT Images: From the Fleischner Society 2017; Radiology 2017; 284:228-243. Electronically Signed   By: Anner Crete M.D.   On: 03/10/2016 03:06    Procedures Procedures (including critical care time)  Medications Ordered in ED Medications  levofloxacin (LEVAQUIN) tablet 750 mg (not administered)  albuterol (PROVENTIL HFA;VENTOLIN HFA) 108 (90 Base) MCG/ACT inhaler 2 puff (not administered)  aerochamber Z-Stat Plus/medium 1 each (not administered)  ipratropium-albuterol (DUONEB) 0.5-2.5 (3) MG/3ML nebulizer solution 3 mL (3 mLs Nebulization Given 03/10/16 0050)  albuterol (PROVENTIL) (2.5 MG/3ML) 0.083% nebulizer solution 2.5 mg (2.5 mg Nebulization Given 03/10/16 0050)  albuterol (PROVENTIL) (2.5 MG/3ML) 0.083% nebulizer solution 2.5 mg (2.5 mg Nebulization Given 03/10/16 0210)  ipratropium-albuterol (DUONEB)  0.5-2.5 (3) MG/3ML nebulizer solution 3 mL (3 mLs Nebulization Given 03/10/16 0210)  iopamidol (ISOVUE-370) 76 % injection 100 mL (100 mLs Intravenous Contrast Given 03/10/16 0226)     Initial Impression / Assessment and Plan / ED Course  I have reviewed the triage vital signs and the nursing notes.  Pertinent labs & imaging results that were available during my care of the patient were reviewed by me and considered in my medical decision making (see chart for details).    COORDINATION OF CARE: 12:23 AM Discussed treatment plan with pt at bedside and pt agreed to plan. Pt was given a nebulizer treatment to see if that helped with his feeling of wheezing, although I didn't hear any abnormal sounds.   Pt ambulated by nursing staff and his pulse ox remained 94-96% on RA, he didn't get SOB, but c/o feeling light headed with exertion.   Recheck 01:40 AM Pt states the first nebulizer helped. On exam he still has clear lungs, with mild improvement of air flow. We discussed concern for the shortness of breath without overt wheezing and rhonchi in his past history of a pulmonary embolus that a CT should be done to look for another pulmonary embolus. CT angiogram of the chest was ordered.  Recheck at time of discharge. Patient states he feels improved after 2 nebulizers. We discussed his CT results. He was started on Levaquin for his pneumonia. He is advised follow up with his family doctor in the next couple of days. The mastoid therapist instructed him on how to use the spacer and inhaler.  Final Clinical Impressions(s) / ED Diagnoses   Final diagnoses:  Community acquired pneumonia of right upper lobe of lung (HCC)    New Prescriptions New Prescriptions   LEVOFLOXACIN (LEVAQUIN) 750 MG TABLET    Take 1 tablet (750 mg total) by mouth daily.    Plan discharge  Donald Porter, MD, Barbette Or, MD 03/10/16 [phone]

## 2016-03-10 NOTE — ED Notes (Signed)
Pt's pulse ox fluctuated between 94-96% during ambulation. Pt stated that he was beginning to feel light headed as we neared the room, but pulse ox remained the same.

## 2016-03-10 NOTE — Discharge Instructions (Signed)
Drink plenty of fluids. Use the inhaler 2 puffs every 4-6 hrs as needed for shortness of breath. You can take mucinex DM OTC for cough. Monitor yourself for a fever. You can take acetaminophen 650 mg every 6 hrs as needed for fever.  Have your doctor recheck you in the next couple of days. Return to the ED if you get worse, struggle to breathe, have vomiting and unable to take your medications, get chest pain.

## 2016-12-30 ENCOUNTER — Ambulatory Visit: Payer: Medicaid Other | Admitting: Cardiology

## 2016-12-30 ENCOUNTER — Encounter: Payer: Self-pay | Admitting: Cardiology

## 2016-12-30 ENCOUNTER — Telehealth: Payer: Self-pay | Admitting: Cardiology

## 2016-12-30 VITALS — BP 140/90 | HR 87 | Ht 69.0 in | Wt 220.6 lb

## 2016-12-30 DIAGNOSIS — Z136 Encounter for screening for cardiovascular disorders: Secondary | ICD-10-CM | POA: Diagnosis not present

## 2016-12-30 DIAGNOSIS — M79604 Pain in right leg: Secondary | ICD-10-CM

## 2016-12-30 DIAGNOSIS — E118 Type 2 diabetes mellitus with unspecified complications: Secondary | ICD-10-CM

## 2016-12-30 DIAGNOSIS — M79605 Pain in left leg: Secondary | ICD-10-CM

## 2016-12-30 DIAGNOSIS — Z8679 Personal history of other diseases of the circulatory system: Secondary | ICD-10-CM | POA: Diagnosis not present

## 2016-12-30 NOTE — Patient Instructions (Signed)
Medication Instructions:  Your physician recommends that you continue on your current medications as directed. Please refer to the Current Medication list given to you today.  Labwork: NONE  Testing/Procedures: Your physician has requested that you have a lower or upper extremity arterial duplex. This test is an ultrasound of the arteries in the legs or arms. It looks at arterial blood flow in the legs and arms. Allow one hour for Lower and Upper Arterial scans. There are no restrictions or special instructions  Your physician has requested that you have an ankle brachial index (ABI). During this test an ultrasound and blood pressure cuff are used to evaluate the arteries that supply the arms and legs with blood. Allow thirty minutes for this exam. There are no restrictions or special instructions.  Follow-Up: Your physician recommends that you schedule a follow-up appointment PENDING TEST RESULTS  Any Other Special Instructions Will Be Listed Below (If Applicable).  If you need a refill on your cardiac medications before your next appointment, please call your pharmacy.

## 2016-12-30 NOTE — Progress Notes (Signed)
Cardiology Office Note  Date: 12/30/2016   ID: Donald Mason, DOB 08-06-1955, MRN [id number]  PCP: Wyatt Haste, NP  Consulting cardiologist: Rozann Lesches, MD   Chief Complaint  Patient presents with  . Foot and leg pain    History of Present Illness: Donald Mason is a 61 y.o. male referred for cardiology consultation by Mr. Sharol Roussel NP from the [facility] for the evaluation of foot and leg pain.  I reviewed his records.  He has a history of rectal cancer status post XRT and chemotherapy (FOLFOX, 5FU), with colon resection last year, and subsequently reversal of an ileostomy.  He states that since undergoing chemotherapy he has developed progressive foot and lower leg pain.  Describes a feeling of numbness and sharp as well as sometimes burning sensation suggestive of neuropathy.  He is referred today to exclude arterial insufficiency.  He does have a concurrent history of type 2 diabetes mellitus, hypertension, and hyperlipidemia.  I personally reviewed his ECG today which shows normal sinus rhythm.  Echocardiogram from January of last year at [facility] showed LVEF 55-60% with no major valvular abnormalities and a trivial pericardial effusion.  He reports a history of rheumatic fever as a child.  Past Medical History:  Diagnosis Date  . History of pulmonary embolus (PE)   . Hyperlipidemia   . Hypertension   . Rectal cancer ([place])   . Type 2 diabetes mellitus ([place])   . Varicose veins of legs     Past Surgical History:  Procedure Laterality Date  . Arm surgery    . COLON RESECTION  2017  . COLONOSCOPY N/A 02/26/2015   Procedure: COLONOSCOPY;  Surgeon: Danie Binder, MD;  [facility];  Service: Endoscopy;  Laterality: N/A;  1245   . COLONOSCOPY N/A 03/14/2015   Procedure: COLONOSCOPY;  Surgeon: Danie Binder, MD;  [facility];  Service: Endoscopy;  Laterality: N/A;  730   . COLOSTOMY REVERSAL  2018  .  ESOPHAGOGASTRODUODENOSCOPY N/A 02/26/2015   Procedure: ESOPHAGOGASTRODUODENOSCOPY (EGD);  Surgeon: Danie Binder, MD;  [facility];  Service: Endoscopy;  Laterality: N/A;  . LEG SURGERY    . TONSILLECTOMY      Current Outpatient Medications  Medication Sig Dispense Refill  . acetaminophen (TYLENOL) 500 MG tablet Take 1,000 mg by mouth every 6 (six) hours as needed for mild pain.    Marland Kitchen ALPRAZolam (XANAX) 1 MG tablet TAKE ONE TABLET BY MOUTH THREE TIMES DAILY AS NEEDED.    Marland Kitchen ibuprofen (ADVIL,MOTRIN) 600 MG tablet Take 1 tablet (600 mg total) by mouth 3 (three) times daily. WITH FOOD OR MILK 90 tablet 3  . metFORMIN (GLUCOPHAGE) 500 MG tablet Take 500 mg by mouth daily.     . Omega-3 Fatty Acids (FISH OIL PO) Take 2 capsules by mouth daily.    Marland Kitchen omeprazole (PRILOSEC) 20 MG capsule Take 20 mg as needed by mouth.     No current facility-administered medications for this visit.    Allergies:  Patient has no known allergies.   Social History: The patient  reports that he quit smoking about 5 years ago. He has a 30.00 pack-year smoking history. His smokeless tobacco use includes chew. He reports that he drinks alcohol. He reports that he does not use drugs.   Family History: The patient's family history includes Cancer in his mother; Colon cancer in his paternal grandmother; Colon polyps in his sister; Leukemia in his sister.  ROS:  Please see the history of present illness. Otherwise, complete review of systems is positive for trouble with memory.  All other systems are reviewed and negative.   Physical Exam: VS:  BP 140/90 (BP Location: Right Arm, Cuff Size: Normal)   Pulse 87   Ht 5\' 9"  (1.753 m)   Wt 220 lb 9.6 oz (100.1 kg)   SpO2 96%   BMI 32.58 kg/m , BMI Body mass index is 32.58 kg/m.  Wt Readings from Last 3 Encounters:  12/30/16 220 lb 9.6 oz (100.1 kg)  03/09/16 200 lb (90.7 kg)  03/14/15 240 lb (108.9 kg)    General: Obese male, appears comfortable at  rest. HEENT: Conjunctiva and lids normal, oropharynx clear. Neck: Supple, no elevated JVP or carotid bruits, no thyromegaly. Lungs: Clear to auscultation, nonlabored breathing at rest. Cardiac: Regular rate and rhythm, no S3 or significant systolic murmur, no pericardial rub. Abdomen: Soft, nontender, bowel sounds present, no guarding or rebound. Extremities: No pitting edema, distal pulses 1+, DPs palpable. Skin: Warm and dry. Musculoskeletal: No kyphosis. Neuropsychiatric: Alert and oriented x3, affect grossly appropriate.  ECG: There is no old tracing available for comparison.  Recent Labwork: 03/10/2016: BUN 4; Creatinine, Ser 0.80; Hemoglobin 12.2; Potassium 3.4; Sodium 139  November 2018: D-dimer 0.14 December 2016: BUN 7, creatinine 0.77, potassium 3.9, AST 25, ALT 32, hemoglobin A1c 5.7  Other Studies Reviewed Today:  Chest CTA 03/10/2016: IMPRESSION: No CT evidence of pulmonary embolism.  Cluster of faint nodular density in the right upper lobe concerning for pneumonia. Clinical correlation and follow-up recommended.  Small subpleural linear and ground-glass density and associated 6 mm nodule in the posterior segment of the right upper lobe likely related to sequela of prior infection and scarring. Non-contrast chest CT at 6-12 months is recommended. If the nodule is stable at time of repeat CT, then future CT at 18-24 months (from today's scan) is considered optional for low-risk patients, but is recommended for high-risk patients. This recommendation follows the consensus statement: Guidelines for Management of Incidental Pulmonary Nodules Detected on CT Images: From the Fleischner Society 2017; Radiology 2017; 284:228-243.  Echocardiogram 03/17/2015 [facility]); SUMMARY The left ventricular size is normal. There is normal left ventricular wall thickness. LV ejection fraction = 55-60%. Overall left ventricular function appears to be normal . The left ventricular wall  motion is normal. The right ventricle is normal in size and function. The left atrial volume is normal.  Right atrial size is normal. IVC size was normal. There is trivial pericardial effusion. There is no significant valvular stenosis or regurgitation There is no comparison study available. - FINDINGS:  LEFT VENTRICLE The left ventricular size is normal. There is normal left ventricular wall  thickness. LV ejection fraction = 55-60%. Overall left ventricular function  appears to be normal . Left ventricular filling pattern is impaired. The left  ventricular wall motion is normal. -  RIGHT VENTRICLE The right ventricle is normal in size and function.  LEFT ATRIUM The left atrial volume is normal.  RIGHT ATRIUM  Right atrial size is normal. The interatrial septum is intact with no  evidence for an atrial septal defect. - AORTIC VALVE There is aortic valve sclerosis. The aortic valve opens well. There is no  aortic regurgitation. - MITRAL VALVE The mitral valve leaflets appear normal. There is trace mitral regurgitation. - TRICUSPID VALVE Structurally normal tricuspid valve. There is trace tricuspid regurgitation.  There was insufficient TR detected to calculate RV systolic pressure. - PULMONIC  VALVE The pulmonic valve is not well visualized. There is no pulmonic valvular  regurgitation. - ARTERIES The aortic root is normal size. - VENOUS Pulmonary venous flow pattern is normal. IVC size was normal. - EFFUSION There is trivial pericardial effusion. - - MMode/2D Measurements & Calculations IVSd: 1.0 cm LVIDd: 4.6 cm LVPWd: 1.1 cm LVIDs: 3.4 cm LA diam: 3.2 cm Ao root: 3.4 cm EDV(MOD-sp4): 82.4 ml ESV(MOD-sp4): 33.6 ml EDV(MOD-sp2): 65.2 ml ESV(MOD-sp2): 30.8 ml asc Aorta Diam: 3.0 cm LVOT diam: 1.9 cm LVOT area: 2.9 cm2 SV(MOD-sp4): 48.8 ml SI(MOD-sp4): 22.0 ml/m2 LA area A2: 17.5 cm2 LA area A4: 19.9 cm2 LA length (vol): 5.5 cm LA vol: 53.5  ml LA vol index: 24.1 ml/m2 RA area A4: 16.1 cm2 Doppler Measurements & Calculations MV E max vel: 80.1 cm/sec MV A max vel: 84.9 cm/sec MV E/A: 0.94 Med Peak E' Vel: 7.1 cm/sec Lat Peak E' Vel: 9.5 cm/sec E/Lat E`: 8.5 E/Med E`: 11.4 MV dec time: 0.22 sec SV(LVOT): 69.7 ml Ao max PG: 9.7 mmHg Ao mean PG: 3.8 mmHg Ao V2 VTI: 27.9 cm AVA (VTI): 2.5 cm2 LV V1 VTI: 23.9 cm RAP systole: 5.0 mmHg AS Dimensionless Index (VTI): 0.86 AVAi(VTI) cm^2/m^2: 1.1 cm2 SV index(LVOT): 31.4 ml/m2  Assessment and Plan:  1.  Bilateral foot and lower leg pain, description sounds more consistent with neuropathy which could be either related to previous chemotherapy or possibly type 2 diabetes mellitus.  DPs are palpable bilaterally at 1+.  We will obtain lower extremity arterial Dopplers and ABIs for further evaluation to exclude any potential associated arterial insufficiency.  2.  Reported history of rheumatic fever as a child.  He has no evidence of valvular heart disease based on echocardiogram from last year.  3.  Type 2 diabetes mellitus, currently on Glucophage.  Recent hemoglobin A1c 5.7.  4.  History of hypertension.  Currently not on antihypertensive medication.  Keep follow-up with PCP.  Current medicines were reviewed with the patient today.   Orders Placed This Encounter  Procedures  . EKG 12-Lead    Disposition: Call with test results.  Signed, Satira Sark, MD, [facility] 12/30/2016 3:31 PM    [facility] [place] [facility] [place], [place], [place] [address] Phone: [phone]; Fax: [phone]

## 2016-12-30 NOTE — Telephone Encounter (Signed)
Pre-cert Verification for the following procedure   ABI W/WO TBI & LE arterial doppler scheduled for 01-12-17

## 2017-01-04 ENCOUNTER — Other Ambulatory Visit: Payer: Self-pay | Admitting: Cardiology

## 2017-01-04 DIAGNOSIS — I739 Peripheral vascular disease, unspecified: Secondary | ICD-10-CM

## 2017-01-12 ENCOUNTER — Ambulatory Visit (INDEPENDENT_AMBULATORY_CARE_PROVIDER_SITE_OTHER): Payer: Medicaid Other

## 2017-01-12 ENCOUNTER — Ambulatory Visit: Payer: Medicaid Other

## 2017-01-12 DIAGNOSIS — I739 Peripheral vascular disease, unspecified: Secondary | ICD-10-CM

## 2017-01-14 ENCOUNTER — Telehealth: Payer: Self-pay

## 2017-01-14 NOTE — Telephone Encounter (Signed)
-----   Message from Satira Sark, MD sent at 01/14/2017 11:22 AM EST ----- Results reviewed.  Please let him know that the arterial studies were normal and did not suggest any obstruction to explain his foot pain.  Recommend continued follow-up with PCP, could be related to neuropathy as we discussed. A copy of this test should be forwarded to Wyatt Haste, NP.

## 2017-01-14 NOTE — Telephone Encounter (Signed)
Patient notified. Routed to PCP 

## 2017-06-09 ENCOUNTER — Other Ambulatory Visit (HOSPITAL_COMMUNITY): Payer: Self-pay | Admitting: Family

## 2017-06-09 ENCOUNTER — Ambulatory Visit (HOSPITAL_COMMUNITY)
Admission: RE | Admit: 2017-06-09 | Discharge: 2017-06-09 | Disposition: A | Discharge status: Home / Self Care | Payer: Self-pay | Source: Ambulatory Visit | Attending: Family | Admitting: Family

## 2017-06-09 DIAGNOSIS — R1900 Intra-abdominal and pelvic swelling, mass and lump, unspecified site: Secondary | ICD-10-CM | POA: Insufficient documentation

## 2017-06-27 IMAGING — DX DG CHEST 2V
2 series · 2 of 2 positions shown · non-contrast
Comparison: 05/27/2009.

CLINICAL DATA: Colonoscopy.  Rectal mass.  History of CHF.

EXAM:
CHEST  2 VIEW

[chest pa]
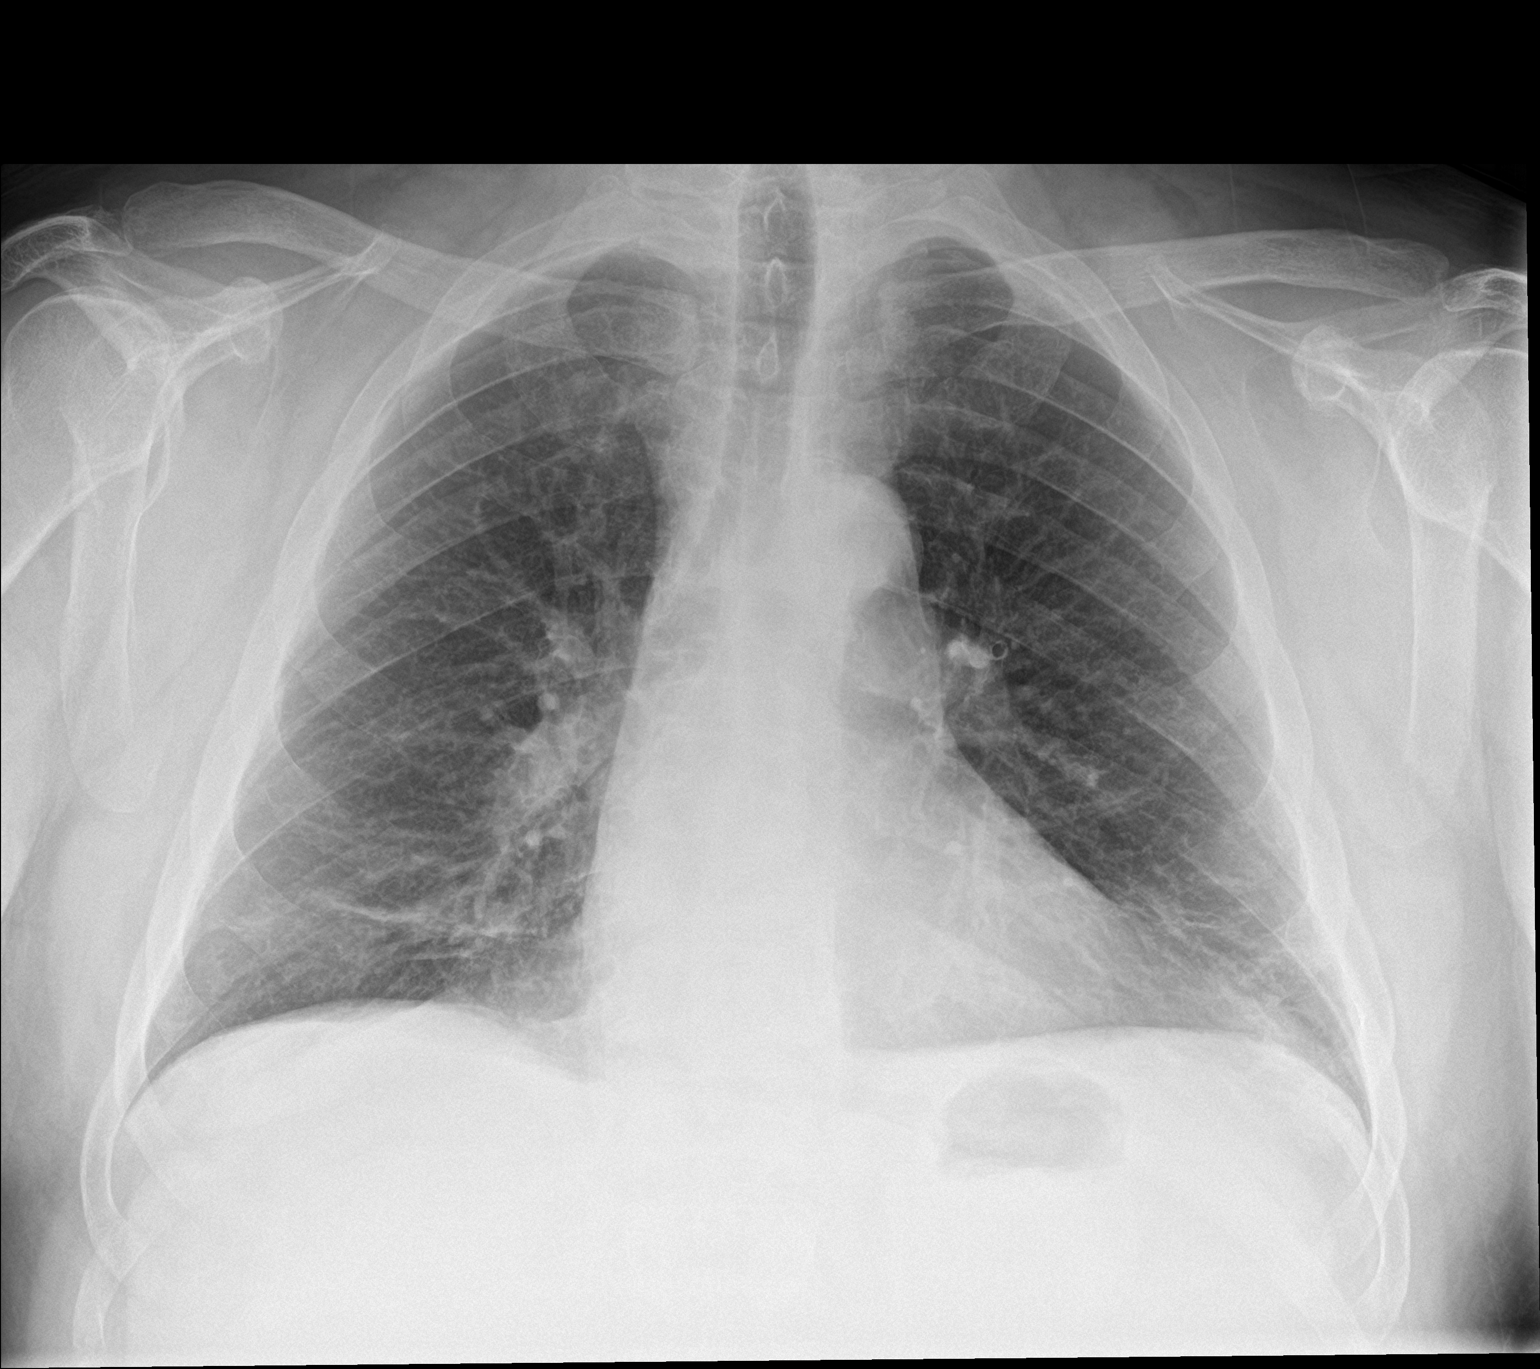

[chest lat]
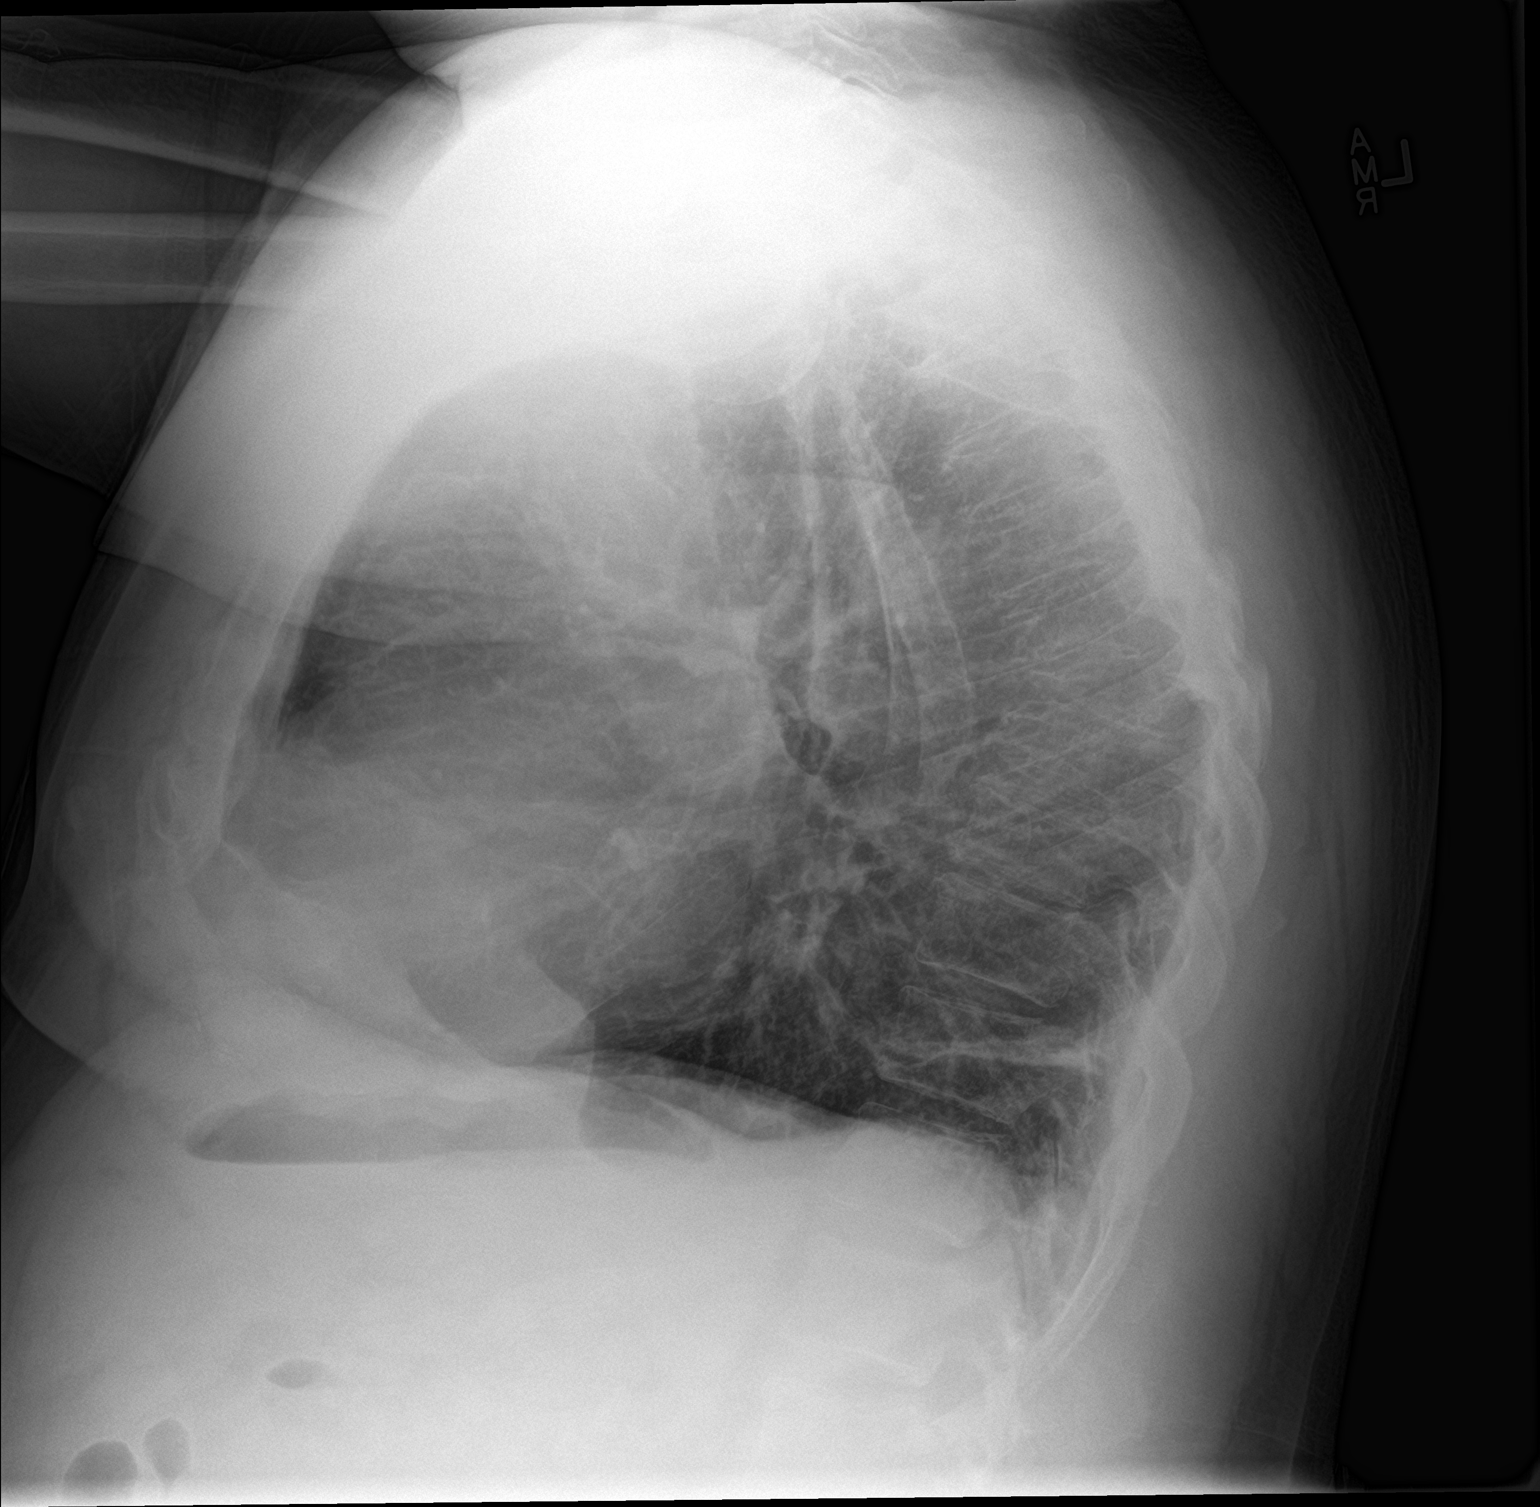

[2 of 2 positions shown; findings below may reference images not displayed]

FINDINGS: Mediastinum and hilar structures normal. Low lung volumes with mild
bibasilar atelectasis and/or scarring. No pleural effusion or
pneumothorax. Heart size stable. Diffuse osteopenia degenerative
change. Stable thoracic spine compression fractures .
IMPRESSION: Low lung volumes with mild bibasilar atelectasis and/or scarring.

## 2017-08-01 ENCOUNTER — Encounter: Payer: Self-pay | Admitting: Gastroenterology

## 2017-10-26 ENCOUNTER — Ambulatory Visit: Payer: Self-pay | Admitting: Gastroenterology

## 2020-06-26 ENCOUNTER — Ambulatory Visit: Payer: Medicare HMO | Admitting: General Surgery

## 2022-04-20 DIAGNOSIS — Z8505 Personal history of malignant neoplasm of liver: Secondary | ICD-10-CM | POA: Diagnosis not present

## 2022-04-20 DIAGNOSIS — I10 Essential (primary) hypertension: Secondary | ICD-10-CM | POA: Diagnosis not present

## 2022-04-20 DIAGNOSIS — G8929 Other chronic pain: Secondary | ICD-10-CM | POA: Diagnosis not present

## 2022-04-20 DIAGNOSIS — R413 Other amnesia: Secondary | ICD-10-CM | POA: Diagnosis not present

## 2022-04-20 DIAGNOSIS — E785 Hyperlipidemia, unspecified: Secondary | ICD-10-CM | POA: Diagnosis not present

## 2022-05-04 DIAGNOSIS — R918 Other nonspecific abnormal finding of lung field: Secondary | ICD-10-CM | POA: Diagnosis not present

## 2022-05-04 DIAGNOSIS — C20 Malignant neoplasm of rectum: Secondary | ICD-10-CM | POA: Diagnosis not present

## 2022-05-04 DIAGNOSIS — G893 Neoplasm related pain (acute) (chronic): Secondary | ICD-10-CM | POA: Diagnosis not present

## 2022-05-04 DIAGNOSIS — K6289 Other specified diseases of anus and rectum: Secondary | ICD-10-CM | POA: Diagnosis not present

## 2022-05-04 DIAGNOSIS — Z923 Personal history of irradiation: Secondary | ICD-10-CM | POA: Diagnosis not present

## 2022-05-04 DIAGNOSIS — Z79891 Long term (current) use of opiate analgesic: Secondary | ICD-10-CM | POA: Diagnosis not present

## 2022-05-04 DIAGNOSIS — Z9221 Personal history of antineoplastic chemotherapy: Secondary | ICD-10-CM | POA: Diagnosis not present

## 2022-09-14 DIAGNOSIS — E785 Hyperlipidemia, unspecified: Secondary | ICD-10-CM | POA: Diagnosis not present

## 2022-09-14 DIAGNOSIS — I83813 Varicose veins of bilateral lower extremities with pain: Secondary | ICD-10-CM | POA: Diagnosis not present

## 2022-09-14 DIAGNOSIS — Z713 Dietary counseling and surveillance: Secondary | ICD-10-CM | POA: Diagnosis not present

## 2022-09-14 DIAGNOSIS — Z85048 Personal history of other malignant neoplasm of rectum, rectosigmoid junction, and anus: Secondary | ICD-10-CM | POA: Diagnosis not present

## 2022-09-14 DIAGNOSIS — I10 Essential (primary) hypertension: Secondary | ICD-10-CM | POA: Diagnosis not present

## 2022-09-14 DIAGNOSIS — Z79899 Other long term (current) drug therapy: Secondary | ICD-10-CM | POA: Diagnosis not present

## 2022-10-04 DIAGNOSIS — Z87891 Personal history of nicotine dependence: Secondary | ICD-10-CM | POA: Diagnosis not present

## 2022-10-04 DIAGNOSIS — E785 Hyperlipidemia, unspecified: Secondary | ICD-10-CM | POA: Diagnosis not present

## 2022-10-04 DIAGNOSIS — R079 Chest pain, unspecified: Secondary | ICD-10-CM | POA: Diagnosis not present

## 2022-10-04 DIAGNOSIS — Z85048 Personal history of other malignant neoplasm of rectum, rectosigmoid junction, and anus: Secondary | ICD-10-CM | POA: Diagnosis not present

## 2022-10-04 DIAGNOSIS — R197 Diarrhea, unspecified: Secondary | ICD-10-CM | POA: Diagnosis not present

## 2022-10-04 DIAGNOSIS — E860 Dehydration: Secondary | ICD-10-CM | POA: Diagnosis not present

## 2022-10-04 DIAGNOSIS — I10 Essential (primary) hypertension: Secondary | ICD-10-CM | POA: Diagnosis not present

## 2022-10-08 DIAGNOSIS — R197 Diarrhea, unspecified: Secondary | ICD-10-CM | POA: Diagnosis not present

## 2022-10-08 DIAGNOSIS — M25552 Pain in left hip: Secondary | ICD-10-CM | POA: Diagnosis not present

## 2022-10-08 DIAGNOSIS — C20 Malignant neoplasm of rectum: Secondary | ICD-10-CM | POA: Diagnosis not present

## 2022-10-21 DIAGNOSIS — K611 Rectal abscess: Secondary | ICD-10-CM | POA: Diagnosis not present

## 2022-10-21 DIAGNOSIS — J90 Pleural effusion, not elsewhere classified: Secondary | ICD-10-CM | POA: Diagnosis not present

## 2022-10-21 DIAGNOSIS — Z635 Disruption of family by separation and divorce: Secondary | ICD-10-CM | POA: Diagnosis not present

## 2022-10-21 DIAGNOSIS — J9811 Atelectasis: Secondary | ICD-10-CM | POA: Diagnosis not present

## 2022-10-21 DIAGNOSIS — E43 Unspecified severe protein-calorie malnutrition: Secondary | ICD-10-CM | POA: Diagnosis not present

## 2022-10-21 DIAGNOSIS — Z79899 Other long term (current) drug therapy: Secondary | ICD-10-CM | POA: Diagnosis not present

## 2022-10-21 DIAGNOSIS — Z87891 Personal history of nicotine dependence: Secondary | ICD-10-CM | POA: Diagnosis not present

## 2022-10-21 DIAGNOSIS — G8918 Other acute postprocedural pain: Secondary | ICD-10-CM | POA: Diagnosis not present

## 2022-10-21 DIAGNOSIS — E119 Type 2 diabetes mellitus without complications: Secondary | ICD-10-CM | POA: Diagnosis not present

## 2022-10-21 DIAGNOSIS — Z85048 Personal history of other malignant neoplasm of rectum, rectosigmoid junction, and anus: Secondary | ICD-10-CM | POA: Diagnosis not present

## 2022-10-21 DIAGNOSIS — K651 Peritoneal abscess: Secondary | ICD-10-CM | POA: Diagnosis not present

## 2022-10-21 DIAGNOSIS — C20 Malignant neoplasm of rectum: Secondary | ICD-10-CM | POA: Diagnosis not present

## 2022-10-21 DIAGNOSIS — I10 Essential (primary) hypertension: Secondary | ICD-10-CM | POA: Diagnosis not present

## 2022-11-12 ENCOUNTER — Other Ambulatory Visit: Payer: Self-pay | Admitting: *Deleted

## 2022-11-12 DIAGNOSIS — I8393 Asymptomatic varicose veins of bilateral lower extremities: Secondary | ICD-10-CM

## 2022-11-17 DIAGNOSIS — Z7984 Long term (current) use of oral hypoglycemic drugs: Secondary | ICD-10-CM | POA: Diagnosis not present

## 2022-11-17 DIAGNOSIS — Z792 Long term (current) use of antibiotics: Secondary | ICD-10-CM | POA: Diagnosis not present

## 2022-11-17 DIAGNOSIS — M726 Necrotizing fasciitis: Secondary | ICD-10-CM | POA: Diagnosis not present

## 2022-11-17 DIAGNOSIS — M7989 Other specified soft tissue disorders: Secondary | ICD-10-CM | POA: Diagnosis not present

## 2022-11-17 DIAGNOSIS — M79605 Pain in left leg: Secondary | ICD-10-CM | POA: Diagnosis not present

## 2022-11-17 DIAGNOSIS — Z87891 Personal history of nicotine dependence: Secondary | ICD-10-CM | POA: Diagnosis not present

## 2022-11-17 DIAGNOSIS — C20 Malignant neoplasm of rectum: Secondary | ICD-10-CM | POA: Diagnosis not present

## 2022-11-17 DIAGNOSIS — Z635 Disruption of family by separation and divorce: Secondary | ICD-10-CM | POA: Diagnosis not present

## 2022-11-17 DIAGNOSIS — Z79899 Other long term (current) drug therapy: Secondary | ICD-10-CM | POA: Diagnosis not present

## 2022-11-17 DIAGNOSIS — E119 Type 2 diabetes mellitus without complications: Secondary | ICD-10-CM | POA: Diagnosis not present

## 2022-11-17 DIAGNOSIS — M79604 Pain in right leg: Secondary | ICD-10-CM | POA: Diagnosis not present

## 2022-11-17 DIAGNOSIS — Z85040 Personal history of malignant carcinoid tumor of rectum: Secondary | ICD-10-CM | POA: Diagnosis not present

## 2022-11-17 DIAGNOSIS — I82402 Acute embolism and thrombosis of unspecified deep veins of left lower extremity: Secondary | ICD-10-CM | POA: Diagnosis not present

## 2022-11-17 DIAGNOSIS — L03116 Cellulitis of left lower limb: Secondary | ICD-10-CM | POA: Diagnosis not present

## 2022-11-17 DIAGNOSIS — I10 Essential (primary) hypertension: Secondary | ICD-10-CM | POA: Diagnosis not present

## 2022-11-17 DIAGNOSIS — R21 Rash and other nonspecific skin eruption: Secondary | ICD-10-CM | POA: Diagnosis not present

## 2022-11-17 DIAGNOSIS — R600 Localized edema: Secondary | ICD-10-CM | POA: Diagnosis not present

## 2022-11-17 DIAGNOSIS — E785 Hyperlipidemia, unspecified: Secondary | ICD-10-CM | POA: Diagnosis not present

## 2022-11-17 DIAGNOSIS — Z8719 Personal history of other diseases of the digestive system: Secondary | ICD-10-CM | POA: Diagnosis not present

## 2022-11-17 DIAGNOSIS — Z933 Colostomy status: Secondary | ICD-10-CM | POA: Diagnosis not present

## 2022-11-17 DIAGNOSIS — C787 Secondary malignant neoplasm of liver and intrahepatic bile duct: Secondary | ICD-10-CM | POA: Diagnosis not present

## 2022-11-19 ENCOUNTER — Ambulatory Visit (HOSPITAL_COMMUNITY): Admission: RE | Admit: 2022-11-19 | Payer: 59 | Source: Ambulatory Visit

## 2022-11-19 ENCOUNTER — Encounter (HOSPITAL_COMMUNITY): Payer: Medicare HMO

## 2022-11-25 DIAGNOSIS — K9189 Other postprocedural complications and disorders of digestive system: Secondary | ICD-10-CM | POA: Diagnosis not present

## 2022-11-29 DIAGNOSIS — Z933 Colostomy status: Secondary | ICD-10-CM | POA: Diagnosis not present

## 2022-12-07 DIAGNOSIS — L03116 Cellulitis of left lower limb: Secondary | ICD-10-CM | POA: Diagnosis not present

## 2022-12-14 DIAGNOSIS — C20 Malignant neoplasm of rectum: Secondary | ICD-10-CM | POA: Diagnosis not present

## 2022-12-14 DIAGNOSIS — L03116 Cellulitis of left lower limb: Secondary | ICD-10-CM | POA: Diagnosis not present

## 2022-12-14 DIAGNOSIS — K219 Gastro-esophageal reflux disease without esophagitis: Secondary | ICD-10-CM | POA: Diagnosis not present

## 2022-12-14 DIAGNOSIS — C787 Secondary malignant neoplasm of liver and intrahepatic bile duct: Secondary | ICD-10-CM | POA: Diagnosis not present

## 2022-12-14 DIAGNOSIS — Z713 Dietary counseling and surveillance: Secondary | ICD-10-CM | POA: Diagnosis not present

## 2022-12-14 DIAGNOSIS — Z7182 Exercise counseling: Secondary | ICD-10-CM | POA: Diagnosis not present

## 2022-12-23 DIAGNOSIS — Z933 Colostomy status: Secondary | ICD-10-CM | POA: Diagnosis not present

## 2022-12-23 DIAGNOSIS — C20 Malignant neoplasm of rectum: Secondary | ICD-10-CM | POA: Diagnosis not present

## 2022-12-23 DIAGNOSIS — L03116 Cellulitis of left lower limb: Secondary | ICD-10-CM | POA: Diagnosis not present

## 2023-01-05 DIAGNOSIS — Z933 Colostomy status: Secondary | ICD-10-CM | POA: Diagnosis not present

## 2023-01-06 NOTE — Progress Notes (Deleted)
Patient ID: Donald Mason, male   DOB: 10-05-1955, 67 y.o.   MRN: [id number]  Reason for Consult: No chief complaint on file.   Referred by Waldon Reining, MD  Subjective:     HPI  Donald Mason is a 67 y.o. male who presents for evaluation of lower extremity *** Timeframe: *** Symptoms: *** Varicosities: *** Previous wounds: *** Previous DVT: *** In compression: ***  Past Medical History:  Diagnosis Date   History of pulmonary embolus (PE)    Hyperlipidemia    Hypertension    Rectal cancer ([facility])    Type 2 diabetes mellitus ([facility])    Varicose veins of legs    Family History  Problem Relation Age of Onset   Cancer Mother    Colon polyps Sister    Leukemia Sister    Colon cancer Paternal Grandmother        OVER AGE 54   Pancreatic cancer Neg Hx    Stomach cancer Neg Hx    Ulcerative colitis Neg Hx    Crohn's disease Neg Hx    Past Surgical History:  Procedure Laterality Date   Arm surgery     COLON RESECTION  2017   COLONOSCOPY N/A 02/26/2015   Procedure: COLONOSCOPY;  Surgeon: West Bali, MD;  [facility];  Service: Endoscopy;  Laterality: N/A;  1245    COLONOSCOPY N/A 03/14/2015   Procedure: COLONOSCOPY;  Surgeon: West Bali, MD;  [facility];  Service: Endoscopy;  Laterality: N/A;  730    COLOSTOMY REVERSAL  2018   ESOPHAGOGASTRODUODENOSCOPY N/A 02/26/2015   Procedure: ESOPHAGOGASTRODUODENOSCOPY (EGD);  Surgeon: West Bali, MD;  [facility];  Service: Endoscopy;  Laterality: N/A;   LEG SURGERY     TONSILLECTOMY      Short Social History:  Social History   Tobacco Use   Smoking status: Former    Current packs/day: 0.00    Average packs/day: 1 pack/day for 30.0 years (30.0 ttl pk-yrs)    Types: Cigarettes    Start date: 02/15/1981    Quit date: 02/16/2011    Years since quitting: 11.8   Smokeless tobacco: Current    Types: Chew   Tobacco comments:    dips currently  Substance Use Topics    Alcohol use: Yes    Comment: quit 3-4 years ago.  Recovering alcoholic    No Known Allergies  Current Outpatient Medications  Medication Sig Dispense Refill   acetaminophen (TYLENOL) 500 MG tablet Take 1,000 mg by mouth every 6 (six) hours as needed for mild pain.     ALPRAZolam (XANAX) 1 MG tablet TAKE ONE TABLET BY MOUTH THREE TIMES DAILY AS NEEDED.     ibuprofen (ADVIL,MOTRIN) 600 MG tablet Take 1 tablet (600 mg total) by mouth 3 (three) times daily. WITH FOOD OR MILK 90 tablet 3   metFORMIN (GLUCOPHAGE) 500 MG tablet Take 500 mg by mouth daily.      Omega-3 Fatty Acids (FISH OIL PO) Take 2 capsules by mouth daily.     omeprazole (PRILOSEC) 20 MG capsule Take 20 mg as needed by mouth.     No current facility-administered medications for this visit.    REVIEW OF SYSTEMS  Negative other than noted in HPI     Objective:  Objective   There were no vitals filed for this visit. There is no height or weight on file to calculate BMI.  Physical Exam General: no acute distress Cardiac: hemodynamically  stable Pulm: normal work of breathing Neuro: alert, no focal deficit Extremities: *** Vascular:   Right: palpable DP, PT***  Left: palpable DP, PT***   Data: Reflux study ***      Assessment/Plan:     Donald Mason is a 66 y.o. male with chronic venous insufficiency with C*** disease and reflux noted in *** I explained the foundation of CVI treatment of compression and elevation I recommended medical grade graduated compression stockings and intermittent leg elevation We also discussed that many patients find symptom improvement with exercise and if they have access to a pool should attempt water aerobics.  Plan to follow up in 3 months with Dr. Myra Gianotti or Dr. Randie Heinz with a *** reflux study in order to complete bilateral imaging      Daria Pastures MD [facility]

## 2023-01-07 ENCOUNTER — Encounter (HOSPITAL_COMMUNITY): Payer: 59

## 2023-01-07 ENCOUNTER — Encounter: Payer: 59 | Admitting: Vascular Surgery

## 2023-02-23 DIAGNOSIS — Z933 Colostomy status: Secondary | ICD-10-CM | POA: Diagnosis not present

## 2023-03-24 DIAGNOSIS — C20 Malignant neoplasm of rectum: Secondary | ICD-10-CM | POA: Diagnosis not present

## 2023-04-08 DIAGNOSIS — Z933 Colostomy status: Secondary | ICD-10-CM | POA: Diagnosis not present

## 2023-04-14 DIAGNOSIS — E569 Vitamin deficiency, unspecified: Secondary | ICD-10-CM | POA: Diagnosis not present

## 2023-04-14 DIAGNOSIS — E559 Vitamin D deficiency, unspecified: Secondary | ICD-10-CM | POA: Diagnosis not present

## 2023-04-14 DIAGNOSIS — Z713 Dietary counseling and surveillance: Secondary | ICD-10-CM | POA: Diagnosis not present

## 2023-04-14 DIAGNOSIS — R42 Dizziness and giddiness: Secondary | ICD-10-CM | POA: Diagnosis not present

## 2023-04-14 DIAGNOSIS — Z79899 Other long term (current) drug therapy: Secondary | ICD-10-CM | POA: Diagnosis not present

## 2023-04-14 DIAGNOSIS — Z7182 Exercise counseling: Secondary | ICD-10-CM | POA: Diagnosis not present

## 2023-04-14 DIAGNOSIS — R600 Localized edema: Secondary | ICD-10-CM | POA: Diagnosis not present

## 2023-05-03 DIAGNOSIS — C20 Malignant neoplasm of rectum: Secondary | ICD-10-CM | POA: Diagnosis not present

## 2023-05-03 DIAGNOSIS — E1142 Type 2 diabetes mellitus with diabetic polyneuropathy: Secondary | ICD-10-CM | POA: Diagnosis not present

## 2023-05-03 DIAGNOSIS — G893 Neoplasm related pain (acute) (chronic): Secondary | ICD-10-CM | POA: Diagnosis not present

## 2023-05-19 DIAGNOSIS — K611 Rectal abscess: Secondary | ICD-10-CM | POA: Diagnosis not present

## 2023-05-19 DIAGNOSIS — C20 Malignant neoplasm of rectum: Secondary | ICD-10-CM | POA: Diagnosis not present

## 2023-05-30 DIAGNOSIS — Z933 Colostomy status: Secondary | ICD-10-CM | POA: Diagnosis not present

## 2023-06-02 DIAGNOSIS — Z713 Dietary counseling and surveillance: Secondary | ICD-10-CM | POA: Diagnosis not present

## 2023-06-02 DIAGNOSIS — R7303 Prediabetes: Secondary | ICD-10-CM | POA: Diagnosis not present

## 2023-06-02 DIAGNOSIS — Z7182 Exercise counseling: Secondary | ICD-10-CM | POA: Diagnosis not present

## 2023-06-02 DIAGNOSIS — R202 Paresthesia of skin: Secondary | ICD-10-CM | POA: Diagnosis not present

## 2023-06-02 DIAGNOSIS — E559 Vitamin D deficiency, unspecified: Secondary | ICD-10-CM | POA: Diagnosis not present

## 2023-07-15 DIAGNOSIS — Z933 Colostomy status: Secondary | ICD-10-CM | POA: Diagnosis not present

## 2023-08-02 DIAGNOSIS — Z7182 Exercise counseling: Secondary | ICD-10-CM | POA: Diagnosis not present

## 2023-08-02 DIAGNOSIS — Z713 Dietary counseling and surveillance: Secondary | ICD-10-CM | POA: Diagnosis not present

## 2023-08-02 DIAGNOSIS — R202 Paresthesia of skin: Secondary | ICD-10-CM | POA: Diagnosis not present

## 2023-08-02 DIAGNOSIS — R7303 Prediabetes: Secondary | ICD-10-CM | POA: Diagnosis not present

## 2023-08-02 DIAGNOSIS — E559 Vitamin D deficiency, unspecified: Secondary | ICD-10-CM | POA: Diagnosis not present

## 2023-08-02 DIAGNOSIS — K9400 Colostomy complication, unspecified: Secondary | ICD-10-CM | POA: Diagnosis not present

## 2023-08-08 DIAGNOSIS — Z933 Colostomy status: Secondary | ICD-10-CM | POA: Diagnosis not present

## 2023-08-25 DIAGNOSIS — K9409 Other complications of colostomy: Secondary | ICD-10-CM | POA: Diagnosis not present

## 2023-08-25 DIAGNOSIS — Z933 Colostomy status: Secondary | ICD-10-CM | POA: Diagnosis not present

## 2023-09-15 DIAGNOSIS — K469 Unspecified abdominal hernia without obstruction or gangrene: Secondary | ICD-10-CM | POA: Diagnosis not present

## 2023-09-15 DIAGNOSIS — Z933 Colostomy status: Secondary | ICD-10-CM | POA: Diagnosis not present

## 2023-09-15 DIAGNOSIS — K9409 Other complications of colostomy: Secondary | ICD-10-CM | POA: Diagnosis not present

## 2023-09-28 DIAGNOSIS — K219 Gastro-esophageal reflux disease without esophagitis: Secondary | ICD-10-CM | POA: Diagnosis not present

## 2023-09-28 DIAGNOSIS — K9409 Other complications of colostomy: Secondary | ICD-10-CM | POA: Diagnosis not present

## 2023-09-28 DIAGNOSIS — G4733 Obstructive sleep apnea (adult) (pediatric): Secondary | ICD-10-CM | POA: Diagnosis not present

## 2023-09-28 DIAGNOSIS — I10 Essential (primary) hypertension: Secondary | ICD-10-CM | POA: Diagnosis not present

## 2023-09-28 DIAGNOSIS — E119 Type 2 diabetes mellitus without complications: Secondary | ICD-10-CM | POA: Diagnosis not present

## 2023-09-28 DIAGNOSIS — K469 Unspecified abdominal hernia without obstruction or gangrene: Secondary | ICD-10-CM | POA: Diagnosis not present

## 2023-09-28 DIAGNOSIS — K435 Parastomal hernia without obstruction or  gangrene: Secondary | ICD-10-CM | POA: Diagnosis not present

## 2023-09-28 DIAGNOSIS — E7800 Pure hypercholesterolemia, unspecified: Secondary | ICD-10-CM | POA: Diagnosis not present

## 2023-09-28 DIAGNOSIS — Z79891 Long term (current) use of opiate analgesic: Secondary | ICD-10-CM | POA: Diagnosis not present

## 2023-09-30 ENCOUNTER — Telehealth: Payer: Self-pay

## 2023-09-30 NOTE — Transitions of Care (Post Inpatient/ED Visit) (Signed)
   09/30/2023  Name: CAMBRIDGE DELEO MRN: [id number] DOB: 23-May-1955  Today's TOC FU Call Status: Today's TOC FU Call Status:: Unsuccessful Call (1st Attempt) Unsuccessful Call (1st Attempt) Date: 09/30/23  Attempted to reach the patient regarding the most recent Inpatient/ED visit.  Follow Up Plan: Additional outreach attempts will be made to reach the patient to complete the Transitions of Care (Post Inpatient/ED visit) call.   Shona Prow RN, CCM [place]  VBCI-Population Health RN Care Manager [phone]

## 2023-11-01 DIAGNOSIS — K9400 Colostomy complication, unspecified: Secondary | ICD-10-CM | POA: Diagnosis not present

## 2023-11-01 DIAGNOSIS — R7303 Prediabetes: Secondary | ICD-10-CM | POA: Diagnosis not present

## 2023-11-01 DIAGNOSIS — E559 Vitamin D deficiency, unspecified: Secondary | ICD-10-CM | POA: Diagnosis not present

## 2023-11-01 DIAGNOSIS — Z7182 Exercise counseling: Secondary | ICD-10-CM | POA: Diagnosis not present

## 2023-11-01 DIAGNOSIS — C20 Malignant neoplasm of rectum: Secondary | ICD-10-CM | POA: Diagnosis not present

## 2023-11-01 DIAGNOSIS — Z713 Dietary counseling and surveillance: Secondary | ICD-10-CM | POA: Diagnosis not present

## 2023-11-01 DIAGNOSIS — R202 Paresthesia of skin: Secondary | ICD-10-CM | POA: Diagnosis not present

## 2023-11-03 DIAGNOSIS — K9409 Other complications of colostomy: Secondary | ICD-10-CM | POA: Diagnosis not present

## 2023-11-03 DIAGNOSIS — K469 Unspecified abdominal hernia without obstruction or gangrene: Secondary | ICD-10-CM | POA: Diagnosis not present

## 2023-12-14 NOTE — Progress Notes (Deleted)
 New Patient Pulmonology Office Visit   Subjective:  Patient ID: Donald Mason, male    DOB: 10-28-1955  MRN: [id number]  Referred by: Alston Silvio BROCKS, FNP  CC: No chief complaint on file.   HPI Donald Mason is a 68 y.o. male with hx of HTN, HLD, DM and obesity who presents for initial evaluation of sleep disordered breathing.    The Epworth Sleepiness score is ***/24.   {STOPBANG:33649}    {PULM QUESTIONNAIRES (Optional):33196}  ROS  Allergies: Patient has no known allergies.  Current Outpatient Medications:    acetaminophen  (TYLENOL ) 500 MG tablet, Take 1,000 mg by mouth every 6 (six) hours as needed for mild pain., Disp: , Rfl:    ALPRAZolam (XANAX) 1 MG tablet, TAKE ONE TABLET BY MOUTH THREE TIMES DAILY AS NEEDED., Disp: , Rfl:    ibuprofen  (ADVIL ,MOTRIN ) 600 MG tablet, Take 1 tablet (600 mg total) by mouth 3 (three) times daily. WITH FOOD OR MILK, Disp: 90 tablet, Rfl: 3   metFORMIN (GLUCOPHAGE) 500 MG tablet, Take 500 mg by mouth daily. , Disp: , Rfl:    Omega-3 Fatty Acids (FISH OIL PO), Take 2 capsules by mouth daily., Disp: , Rfl:    omeprazole  (PRILOSEC) 20 MG capsule, Take 20 mg as needed by mouth., Disp: , Rfl:  Past Medical History:  Diagnosis Date   History of pulmonary embolus (PE)    Hyperlipidemia    Hypertension    Rectal cancer ([facility])    Type 2 diabetes mellitus ([facility])    Varicose veins of legs    Past Surgical History:  Procedure Laterality Date   Arm surgery     COLON RESECTION  2017   COLONOSCOPY N/A 02/26/2015   Procedure: COLONOSCOPY;  Surgeon: Margo LITTIE Haddock, MD;  [facility];  Service: Endoscopy;  Laterality: N/A;  1245    COLONOSCOPY N/A 03/14/2015   Procedure: COLONOSCOPY;  Surgeon: Margo LITTIE Haddock, MD;  [facility];  Service: Endoscopy;  Laterality: N/A;  730    COLOSTOMY REVERSAL  2018   ESOPHAGOGASTRODUODENOSCOPY N/A 02/26/2015   Procedure: ESOPHAGOGASTRODUODENOSCOPY (EGD);  Surgeon: Margo LITTIE Haddock, MD;   [facility];  Service: Endoscopy;  Laterality: N/A;   LEG SURGERY     TONSILLECTOMY     Family History  Problem Relation Age of Onset   Cancer Mother    Colon polyps Sister    Leukemia Sister    Colon cancer Paternal Grandmother        OVER AGE 29   Pancreatic cancer Neg Hx    Stomach cancer Neg Hx    Ulcerative colitis Neg Hx    Crohn's disease Neg Hx    Social History   Socioeconomic History   Marital status: Single    Spouse name: Not on file   Number of children: Not on file   Years of education: Not on file   Highest education level: Not on file  Occupational History   Not on file  Tobacco Use   Smoking status: Former    Current packs/day: 0.00    Average packs/day: 1 pack/day for 30.0 years (30.0 ttl pk-yrs)    Types: Cigarettes    Start date: 02/15/1981    Quit date: 02/16/2011    Years since quitting: 12.8   Smokeless tobacco: Current    Types: Chew   Tobacco comments:    dips currently  Substance and Sexual Activity   Alcohol use: Yes    Comment:  quit 3-4 years ago.  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 2 KIDS: [name](32), [name](29).   Social Drivers of Corporate Investment Banker Strain: Low Risk  (11/22/2022)   Received from [facility]   Overall Financial Resource Strain (CARDIA)    Difficulty of Paying Living Expenses: Not hard at all  Food Insecurity: Low Risk  (09/28/2023)   Received from [facility]   Hunger Vital Sign    Within the past 12 months, you worried that your food would run out before you got money to buy more: Never true    Within the past 12 months, the food you bought just didn't last and you didn't have money to get more. : Never true  Transportation Needs: No Transportation Needs (09/28/2023)   Received from [facility]     In the past 12 months, has lack of reliable transportation kept you from medical appointments, meetings, work or from getting things needed for daily living? : No  Physical Activity: Not on file  Stress: Not on file  Social Connections: Not on file  Intimate Partner Violence: Not At Risk (11/22/2022)   Received from [facility]   Humiliation, Afraid, Rape, and Kick questionnaire    Within the last year, have you been afraid of your partner or ex-partner?: No    Within the last year, have you been humiliated or emotionally abused in other ways by your partner or ex-partner?: No    Within the last year, have you been kicked, hit, slapped, or otherwise physically hurt by your partner or ex-partner?: No    Within the last year, have you been raped or forced to have any kind of sexual activity by your partner or ex-partner?: No       Objective:  There were no vitals taken for this visit. {Pulm Vitals (Optional):32837}  Physical Exam  Diagnostic Review:  {Labs (Optional):32838}     Assessment & Plan:   Assessment & Plan   No orders of the defined types were placed in this encounter.     No follow-ups on file.   Christ Fullenwider, MD

## 2023-12-15 ENCOUNTER — Ambulatory Visit: Admitting: Pulmonary Disease

## 2023-12-15 ENCOUNTER — Encounter: Payer: Self-pay | Admitting: Pulmonary Disease

## 2024-02-21 NOTE — Progress Notes (Deleted)
 "  New Patient Pulmonology Office Visit   Subjective:  Patient ID: Donald Mason, male    DOB: Oct 31, 1955  MRN: [id number]  Referred by: Alston Silvio BROCKS, FNP  CC: No chief complaint on file.   HPI Donald Mason is a 69 y.o. male with hx of HTN, HLD, DM, rectal cancer, and PE who presents for initial evaluation of sleep disordered breathing.  The Epworth Sleepiness score is ***/24.   {STOPBANG:33649}   {PULM QUESTIONNAIRES (Optional):33196}  ROS  Allergies: Patient has no known allergies. Current Medications[1] Past Medical History:  Diagnosis Date   History of pulmonary embolus (PE)    Hyperlipidemia    Hypertension    Rectal cancer (HCC)    Type 2 diabetes mellitus (HCC)    Varicose veins of legs    Past Surgical History:  Procedure Laterality Date   Arm surgery     COLON RESECTION  2017   COLONOSCOPY N/A 02/26/2015   Procedure: COLONOSCOPY;  Surgeon: Margo LITTIE Haddock, MD;  Location: [facility];  Service: Endoscopy;  Laterality: N/A;  1245    COLONOSCOPY N/A 03/14/2015   Procedure: COLONOSCOPY;  Surgeon: Margo LITTIE Haddock, MD;  Location: [facility];  Service: Endoscopy;  Laterality: N/A;  730    COLOSTOMY REVERSAL  2018   ESOPHAGOGASTRODUODENOSCOPY N/A 02/26/2015   Procedure: ESOPHAGOGASTRODUODENOSCOPY (EGD);  Surgeon: Margo LITTIE Haddock, MD;  Location: [facility];  Service: Endoscopy;  Laterality: N/A;   LEG SURGERY     TONSILLECTOMY     Family History  Problem Relation Age of Onset   Cancer Mother    Colon polyps Sister    Leukemia Sister    Colon cancer Paternal Grandmother        OVER AGE 94   Pancreatic cancer Neg Hx    Stomach cancer Neg Hx    Ulcerative colitis Neg Hx    Crohn's disease Neg Hx    Social History   Socioeconomic History   Marital status: Single    Spouse name: Not on file   Number of children: Not on file   Years of education: Not on file   Highest education level: Not on file  Occupational History   Not on file   Tobacco Use   Smoking status: Former    Current packs/day: 0.00    Average packs/day: 1 pack/day for 30.0 years (30.0 ttl pk-yrs)    Types: Cigarettes    Start date: 02/15/1981    Quit date: 02/16/2011    Years since quitting: 13.0   Smokeless tobacco: Current    Types: Chew   Tobacco comments:    dips currently  Substance and Sexual Activity   Alcohol use: Yes    Comment: quit 3-4 years ago.  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 2 KIDS: [name](32), [name](29).   Social Drivers of Health   Tobacco Use: Medium Risk (11/03/2023)   Received from [facility]   Patient History    Smoking Tobacco Use: Former    Smokeless Tobacco Use: Never    Passive Exposure: Current  Physicist, Medical Strain: Low Risk (11/22/2022)   Received from [facility]   Overall Financial Resource Strain (CARDIA)    Difficulty of Paying Living Expenses: Not hard at all  Food Insecurity: Low  Risk (09/28/2023)   Received from [facility]   Epic    Within the past 12 months, you worried that your food would run out before you got money to buy more: Never true    Within the past 12 months, the food you bought just didn't last and you didn't have money to get more. : Never true  Transportation Needs: No Transportation Needs (09/28/2023)   Received from [facility]    In the past 12 months, has lack of reliable transportation kept you from medical appointments, meetings, work or from getting things needed for daily living? : No  Physical Activity: Not on file  Stress: Not on file  Social Connections: Not on file  Intimate Partner Violence: Not At Risk (11/22/2022)   Received from [facility]   Epic    Within the last year, have you been afraid of your partner or ex-partner?: No    Within the last year, have you  been humiliated or emotionally abused in other ways by your partner or ex-partner?: No    Within the last year, have you been kicked, hit, slapped, or otherwise physically hurt by your partner or ex-partner?: No    Within the last year, have you been raped or forced to have any kind of sexual activity by your partner or ex-partner?: No  Depression (PHQ2-9): Not on file  Alcohol Screen: Not on file  Housing: Low Risk (09/28/2023)   Received from [facility]   Epic    What is your living situation today?: I have a steady place to live    Think about the place you live. Do you have problems with any of the following? Choose all that apply:: Not on file  Utilities: Low Risk (09/28/2023)   Received from [facility]   Utilities    In the past 12 months has the electric, gas, oil, or water  company threatened to shut off services in your home? : No  Health Literacy: Low Risk (11/22/2022)   Received from [facility] Literacy    How often do you need to have someone help you when you read instructions, pamphlets, or other written material from your doctor or pharmacy?: Never       Objective:  There were no vitals taken for this visit. {Pulm Vitals (Optional):32837}  Physical Exam  Diagnostic Review:  {Labs (Optional):32838}     Assessment & Plan:   Assessment & Plan   No orders of the defined types were placed in this encounter.     No follow-ups on file.   Valita Righter, MD    [1]  Current Outpatient Medications:    acetaminophen  (TYLENOL ) 500 MG tablet, Take 1,000 mg by mouth every 6 (six) hours as needed for mild pain., Disp: , Rfl:    ALPRAZolam (XANAX) 1 MG tablet, TAKE ONE TABLET BY MOUTH THREE TIMES DAILY AS NEEDED., Disp: , Rfl:    ibuprofen  (ADVIL ,MOTRIN ) 600 MG tablet, Take 1 tablet (600 mg total) by mouth 3 (three) times daily. WITH FOOD OR MILK, Disp: 90 tablet, Rfl: 3   metFORMIN (GLUCOPHAGE) 500 MG tablet, Take 500 mg by mouth daily. , Disp: ,  Rfl:    Omega-3 Fatty Acids (FISH OIL PO), Take 2 capsules by mouth daily., Disp: , Rfl:    omeprazole  (PRILOSEC) 20 MG capsule, Take 20 mg as needed by mouth., Disp: , Rfl:   "

## 2024-02-22 ENCOUNTER — Ambulatory Visit: Admitting: Pulmonary Disease

## 2024-03-16 ENCOUNTER — Encounter: Payer: Self-pay | Admitting: *Deleted

## 2024-03-16 NOTE — Progress Notes (Signed)
 Donald Mason                                          MRN: [id number]   03/16/2024   The [facility] Quality Team Specialist reviewed this patient medical record for the purposes of chart review for care gap closure. The following were reviewed: chart review for care gap closure-glycemic status assessment.    [facility] Quality Team

## 2024-04-25 ENCOUNTER — Ambulatory Visit: Admitting: Pulmonary Disease

## 2024-07-02 ENCOUNTER — Ambulatory Visit: Admitting: Diagnostic Neuroimaging
